# Patient Record
Sex: Female | Born: 1960
Health system: Southern US, Community
[De-identification: ages and names within clinical notes are randomized; demographics above are authoritative.]

## PROBLEM LIST (undated history)

## (undated) DIAGNOSIS — J45909 Unspecified asthma, uncomplicated: Secondary | ICD-10-CM

## (undated) DIAGNOSIS — G4733 Obstructive sleep apnea (adult) (pediatric): Secondary | ICD-10-CM

## (undated) DIAGNOSIS — G4730 Sleep apnea, unspecified: Secondary | ICD-10-CM

## (undated) DIAGNOSIS — I10 Essential (primary) hypertension: Secondary | ICD-10-CM

## (undated) DIAGNOSIS — M109 Gout, unspecified: Secondary | ICD-10-CM

## (undated) HISTORY — DX: Obstructive sleep apnea (adult) (pediatric): G47.33

## (undated) HISTORY — DX: Sleep apnea, unspecified: G47.30

## (undated) HISTORY — DX: Unspecified asthma, uncomplicated: J45.909

## (undated) HISTORY — DX: Essential (primary) hypertension: I10

---

## 2008-11-19 ENCOUNTER — Ambulatory Visit: Payer: Self-pay | Admitting: Family Medicine

## 2011-03-11 LAB — HM MAMMOGRAPHY

## 2011-06-09 HISTORY — PX: COLONOSCOPY: SHX174

## 2011-06-09 LAB — HM COLONOSCOPY

## 2014-07-24 ENCOUNTER — Ambulatory Visit: Payer: Self-pay | Admitting: Obstetrics and Gynecology

## 2014-07-24 DIAGNOSIS — I10 Essential (primary) hypertension: Secondary | ICD-10-CM

## 2014-07-24 DIAGNOSIS — Z01810 Encounter for preprocedural cardiovascular examination: Secondary | ICD-10-CM

## 2014-07-24 LAB — BASIC METABOLIC PANEL
Anion Gap: 5 — ABNORMAL LOW (ref 7–16)
BUN: 17 mg/dL (ref 7–18)
CHLORIDE: 106 mmol/L (ref 98–107)
CREATININE: 0.77 mg/dL (ref 0.60–1.30)
Calcium, Total: 8.5 mg/dL (ref 8.5–10.1)
Co2: 30 mmol/L (ref 21–32)
EGFR (African American): >60
Glucose: 129 mg/dL — ABNORMAL HIGH (ref 65–99)
OSMOLALITY: 284 (ref 275–301)
Potassium: 4 mmol/L (ref 3.5–5.1)
Sodium: 141 mmol/L (ref 136–145)

## 2014-07-24 LAB — CBC
HCT: 36.3 % (ref 35.0–47.0)
HGB: 11.6 g/dL — AB (ref 12.0–16.0)
MCH: 28.9 pg (ref 26.0–34.0)
MCHC: 32.1 g/dL (ref 32.0–36.0)
MCV: 90 fL (ref 80–100)
Platelet: 189 10*3/uL (ref 150–440)
RBC: 4.03 10*6/uL (ref 3.80–5.20)
RDW: 16.4 % — ABNORMAL HIGH (ref 11.5–14.5)
WBC: 7.9 10*3/uL (ref 3.6–11.0)

## 2014-08-01 ENCOUNTER — Ambulatory Visit: Payer: Self-pay | Admitting: Obstetrics and Gynecology

## 2014-08-05 LAB — PATHOLOGY REPORT

## 2015-03-07 NOTE — Op Note (Signed)
PATIENT NAME:  Amber Weiss, Amber Weiss MR#:  [id number] DATE OF BIRTH:  08-22-1961  DATE OF PROCEDURE:  08/01/2014  PREOPERATIVE DIAGNOSIS: Endometrial polyp.   POSTOPERATIVE DIAGNOSIS: Endometrial polyp including uterine synechiae.   OPERATION PERFORMED:  Hysteroscopy and dilatation and curettage.   ANESTHESIA USED: General.   PRIMARY SURGEON: Florina OuAndreas M. Bonney AidStaebler, MD.   ESTIMATED BLOOD LOSS: Minimal.   OPERATIVE FLUIDS: 800 mL of crystalloid.   URINE OUTPUT: 50 mL of clear urine.   PREOPERATIVE ANTIBIOTICS: None.    DRAINS OR TUBES: None.   IMPLANTS: None.   COMPLICATIONS: None.   SPECIMENS REMOVED: Endometrial curettings.   INTRAOPERATIVE FINDINGS: There was significant difficulty encountered in visualizing the patient's cervix with the operative speculum given the patient's habitus. The cervix was able to be visualized, but required vaginoscopy using the hysteroscope to visualize the cervix. The cervix was visually dilated using the hysteroscope and entry into the cavity revealed a normal-appearing right tubal ostia, the left tubal ostia was obscured by significant synechiae and there was a small endometrial polyp which was visualized in the anterior left portion of the uterus adjacent to the synechiae. The biopsies were obtained using a MyoSure device, completely resecting the endometrial polyp, biopsying the uterine synechiae as well as obtaining random biopsies anterior, posterior, and right endometrial walls. The cervix was grossly normal in appearance.   CONDITION FOLLOWING PROCEDURE: Stable.   PROCEDURE IN DETAIL: Risks, benefits, and alternatives of the procedure were discussed with the patient prior to proceeding to the operating room. Need for removal of the endometrial polyps was discussed and the patient was quoted a 5% risk of occult malignancy within the endometrial polyps. The patient was taken to the operating room where she was placed under general anesthesia. She was  positioned in the dorsal lithotomy position using Allen stirrups, prepped and draped in the usual sterile fashion. A timeout was performed. Attention was turned to the patient's pelvis. The bladder was straight catheterized with a red rubber catheter for 50 mL of clear urine. An operative speculum was then placed and the cervix was unable to be clearly visualized secondary to habitus as well as length of the operative speculum. Long weighted speculum was attempted to be utilized to visualize the cervix, but this was also unsuccessful. The hysteroscope was introduced through the introitus and followed up to the cervix, which was able to be visualized with the hysteroscope. The cervical os was identified and the hysteroscope was placed at the mouth of the cervical os and then the cervix was dilated using the hysteroscope itself, visually following the cervix. Upon entry into the uterine cavity it became apparent that there was significant synechiae on the left uterine sidewall, the patient does have a history of a prior D and C and this may be the root cause.  The right tubal ostia was clearly visualized. There was a polyp noted anteriorly on the left portion of the uterine cavity near the synechiae. Given the difficulty in visualizing the cervix as well as the fact that in order to gain entry into the uterus it required the full length of the hysteroscope the decision was made to proceed with D and C using a MyoSure device rather than a curette.  The endometrial polyp was completely resected using the MyoSure device. The uterine synechiae on the left were also biopsied using the MyoSure device. Anterior, posterior, and right uterine sidewall biopsies were then taken with the MyoSure device. The remainder of the myometrium looked grossly normal in  appearance. Upon retracting the hysteroscope the cervix was reinspected and noted to be grossly normal as far as cervical canal after removal of the hysteroscope. Sponge,  needle, and instrument counts were correct x 2. The patient tolerated the procedure well, was taken to the recovery room in stable condition.    ____________________________ Florina Ou. Bonney Aid, MD ams:bu D: 08/01/2014 11:29:12 ET T: 08/01/2014 13:09:33 ET JOB#: [id number]  cc: Florina Ou. Bonney Aid, MD, <Dictator> Lorrene Reid MD ELECTRONICALLY SIGNED 08/13/2014 8:03

## 2015-08-21 DIAGNOSIS — G4733 Obstructive sleep apnea (adult) (pediatric): Secondary | ICD-10-CM | POA: Insufficient documentation

## 2015-08-21 DIAGNOSIS — I10 Essential (primary) hypertension: Secondary | ICD-10-CM | POA: Insufficient documentation

## 2015-08-21 DIAGNOSIS — J45909 Unspecified asthma, uncomplicated: Secondary | ICD-10-CM | POA: Insufficient documentation

## 2015-08-24 ENCOUNTER — Ambulatory Visit: Payer: Self-pay | Admitting: Family Medicine

## 2015-09-20 ENCOUNTER — Encounter: Payer: Self-pay | Admitting: Gynecology

## 2015-09-20 ENCOUNTER — Ambulatory Visit
Admission: EM | Admit: 2015-09-20 | Discharge: 2015-09-20 | Disposition: A | Discharge status: Home / Self Care | Payer: No Typology Code available for payment source | Attending: Internal Medicine | Admitting: Internal Medicine

## 2015-09-20 DIAGNOSIS — M10071 Idiopathic gout, right ankle and foot: Secondary | ICD-10-CM

## 2015-09-20 HISTORY — DX: Gout, unspecified: M10.9

## 2015-09-20 MED ORDER — COLCHICINE 0.6 MG PO TABS: 0.6000 mg | ORAL_TABLET | Freq: Every day | ORAL | Status: DC

## 2015-09-20 MED ORDER — NAPROXEN 375 MG PO TABS: 375.0000 mg | ORAL_TABLET | Freq: Two times a day (BID) | ORAL | Status: DC

## 2015-09-20 NOTE — ED Notes (Signed)
Patient c/o right big gout flared up x couple days.

## 2015-09-20 NOTE — Discharge Instructions (Signed)
Naprosyn up to twice daily for pain Take your blood pressure meds right away  Gout Gout is an inflammatory arthritis caused by a buildup of uric acid crystals in the joints. Uric acid is a chemical that is normally present in the blood. When the level of uric acid in the blood is too high it can form crystals that deposit in your joints and tissues. This causes joint redness, soreness, and swelling (inflammation). Repeat attacks are common. Over time, uric acid crystals can form into masses (tophi) near a joint, destroying bone and causing disfigurement. Gout is treatable and often preventable. CAUSES  The disease begins with elevated levels of uric acid in the blood. Uric acid is produced by your body when it breaks down a naturally found substance called purines. Certain foods you eat, such as meats and fish, contain high amounts of purines. Causes of an elevated uric acid level include:  Being passed down from parent to child (heredity).  Diseases that cause increased uric acid production (such as obesity, psoriasis, and certain cancers).  Excessive alcohol use.  Diet, especially diets rich in meat and seafood.  Medicines, including certain cancer-fighting medicines (chemotherapy), water pills (diuretics), and aspirin.  Chronic kidney disease. The kidneys are no longer able to remove uric acid well.  Problems with metabolism. Conditions strongly associated with gout include:  Obesity.  High blood pressure.  High cholesterol.  Diabetes. Not everyone with elevated uric acid levels gets gout. It is not understood why some people get gout and others do not. Surgery, joint injury, and eating too much of certain foods are some of the factors that can lead to gout attacks. SYMPTOMS   An attack of gout comes on quickly. It causes intense pain with redness, swelling, and warmth in a joint.  Fever can occur.  Often, only one joint is involved. Certain joints are more commonly  involved:  Base of the big toe.  Knee.  Ankle.  Wrist.  Finger. Without treatment, an attack usually goes away in a few days to weeks. Between attacks, you usually will not have symptoms, which is different from many other forms of arthritis. DIAGNOSIS  Your caregiver will suspect gout based on your symptoms and exam. In some cases, tests may be recommended. The tests may include:  Blood tests.  Urine tests.  X-rays.  Joint fluid exam. This exam requires a needle to remove fluid from the joint (arthrocentesis). Using a microscope, gout is confirmed when uric acid crystals are seen in the joint fluid. TREATMENT  There are two phases to gout treatment: treating the sudden onset (acute) attack and preventing attacks (prophylaxis).  Treatment of an Acute Attack.  Medicines are used. These include anti-inflammatory medicines or steroid medicines.  An injection of steroid medicine into the affected joint is sometimes necessary.  The painful joint is rested. Movement can worsen the arthritis.  You may use warm or cold treatments on painful joints, depending which works best for you.  Treatment to Prevent Attacks.  If you suffer from frequent gout attacks, your caregiver may advise preventive medicine. These medicines are started after the acute attack subsides. These medicines either help your kidneys eliminate uric acid from your body or decrease your uric acid production. You may need to stay on these medicines for a very long time.  The early phase of treatment with preventive medicine can be associated with an increase in acute gout attacks. For this reason, during the first few months of treatment, your caregiver may  also advise you to take medicines usually used for acute gout treatment. Be sure you understand your caregiver's directions. Your caregiver may make several adjustments to your medicine dose before these medicines are effective.  Discuss dietary treatment with your  caregiver or dietitian. Alcohol and drinks high in sugar and fructose and foods such as meat, poultry, and seafood can increase uric acid levels. Your caregiver or dietitian can advise you on drinks and foods that should be limited. HOME CARE INSTRUCTIONS   Do not take aspirin to relieve pain. This raises uric acid levels.  Only take over-the-counter or prescription medicines for pain, discomfort, or fever as directed by your caregiver.  Rest the joint as much as possible. When in bed, keep sheets and blankets off painful areas.  Keep the affected joint raised (elevated).  Apply warm or cold treatments to painful joints. Use of warm or cold treatments depends on which works best for you.  Use crutches if the painful joint is in your leg.  Drink enough fluids to keep your urine clear or pale yellow. This helps your body get rid of uric acid. Limit alcohol, sugary drinks, and fructose drinks.  Follow your dietary instructions. Pay careful attention to the amount of protein you eat. Your daily diet should emphasize fruits, vegetables, whole grains, and fat-free or low-fat milk products. Discuss the use of coffee, vitamin C, and cherries with your caregiver or dietitian. These may be helpful in lowering uric acid levels.  Maintain a healthy body weight. SEEK MEDICAL CARE IF:   You develop diarrhea, vomiting, or any side effects from medicines.  You do not feel better in 24 hours, or you are getting worse. SEEK IMMEDIATE MEDICAL CARE IF:   Your joint becomes suddenly more tender, and you have chills or a fever. MAKE SURE YOU:   Understand these instructions.  Will watch your condition.  Will get help right away if you are not doing well or get worse.   This information is not intended to replace advice given to you by your health care provider. Make sure you discuss any questions you have with your health care provider.   Document Released: 10/28/2000 Document Revised: 11/21/2014  Document Reviewed: 06/13/2012 Elsevier Interactive Patient Education [employer]

## 2015-09-20 NOTE — ED Provider Notes (Signed)
CSN: [id number]     Arrival date & time 09/20/15  0804 History   First MD Initiated Contact with Patient 09/20/15 0827     Chief Complaint  Patient presents with  . Gout    HPI   Amber Weiss is a pleasant 54 y.o. female who presents with gout.  She reports she reports pain at right great toe.  She has hx of intermittent bouts of gout over the past 4 years.  She has been on colchicine intermittently for 4 years.  She just back on colchicine yesterday.  Pain is 4/10 now, 9/10 last night.  Feels stiff & tender to touch.  She took 4 Ibuprofen & it brought pain to 4/10.  No problems in other joints.  No fever or chills.  No erythema, warmth or rash.     Past Medical History  Diagnosis Date  . Hypertension   . Asthma   . OSA (obstructive sleep apnea)   . Gout    Past Surgical History  Procedure Laterality Date  . Colonoscopy  06/09/11  . Cesarean section     Family History  Problem Relation Age of Onset  . Cancer Mother     breast  . Hypertension Mother   . Cancer Father     skin  . Diabetes Father   . Cancer Sister     bladder   Social History  Substance Use Topics  . Smoking status: Former Games developer  . Smokeless tobacco: Never Used  . Alcohol Use: No   OB History    No data available     Review of Systems  Constitutional: Positive for activity change. Negative for fever, chills, diaphoresis, appetite change, fatigue and unexpected weight change.  HENT: Negative.   Eyes: Negative.   Respiratory: Negative.   Cardiovascular: Negative.   Gastrointestinal: Negative.   Genitourinary: Negative.   Musculoskeletal: Positive for joint swelling and gait problem. Negative for myalgias, back pain, arthralgias, neck pain and neck stiffness.  Skin: Negative.   Neurological: Negative.   Psychiatric/Behavioral: Negative.     Allergies  Penicillins  Home Medications   Prior to Admission medications   Medication Sig Start Date End Date Taking? Authorizing Provider  amLODipine  (NORVASC) 5 MG tablet Take 5 mg by mouth daily.   Yes Historical Provider, MD  budesonide-formoterol (SYMBICORT) 160-4.5 MCG/ACT inhaler Inhale 2 puffs into the lungs 2 (two) times daily.   Yes Historical Provider, MD  albuterol (PROVENTIL HFA;VENTOLIN HFA) 108 (90 BASE) MCG/ACT inhaler Inhale 2 puffs into the lungs every 6 (six) hours as needed for wheezing or shortness of breath.    Historical Provider, MD  colchicine 0.6 MG tablet Take 1 tablet (0.6 mg total) by mouth daily. Take 2 tabs today, then one daily 09/20/15   Joselyn Arrow, NP  losartan (COZAAR) 100 MG tablet Take 100 mg by mouth daily.    Historical Provider, MD  naproxen (NAPROSYN) 375 MG tablet Take 1 tablet (375 mg total) by mouth 2 (two) times daily. 09/20/15   Joselyn Arrow, NP   Meds Ordered and Administered this Visit  Medications - No data to display  BP 147/93 mmHg  Pulse 63  Temp(Src) 97.8 F (36.6 C) (Oral)  Resp 18  Ht  (1.549 m)  Wt 267 lb (121.11 kg)  BMI 50.48 kg/m2  SpO2 98% No data found.   Physical Exam  Constitutional: She is oriented to person, place, and time. She appears well-developed and well-nourished. No distress.  HENT:  Head: Normocephalic and atraumatic.  Eyes: Conjunctivae are normal. Pupils are equal, round, and reactive to light. No scleral icterus.  Neck: Normal range of motion. Neck supple. No thyromegaly present.  Cardiovascular: Normal rate, regular rhythm and normal heart sounds.   Pulmonary/Chest: Effort normal and breath sounds normal. No respiratory distress. She has no wheezes. She has no rales.  Abdominal: Soft. Bowel sounds are normal. She exhibits no distension. There is no tenderness.  Musculoskeletal: Normal range of motion. She exhibits no edema or tenderness.       Feet:  Neurological: She is alert and oriented to person, place, and time. No cranial nerve deficit.  Skin: Skin is warm and dry. No rash noted. No erythema.  Psychiatric: She has a normal mood and  affect. Her speech is normal and behavior is normal. Judgment and thought content normal. Cognition and memory are normal.  Nursing note and vitals reviewed.   ED Course  Procedures none  MDM   1. Acute idiopathic gout of right foot    Right great toe, acute recurrence  Plan: Diagnosis and treatment reviewed with patient Rx as per orders;  benefits, risks, potential side effects reviewed  Recommend supportive treatment with rest, elevation Seek additional medical care if symptoms worsen or are not improving Phone Number given for PCP for patient to establish care.  She is encouraged to return here if symptoms persist prior to appointment with PCP. Patient advised to take her antihypertensive now as she has not yet this morning & BP a bit high.   Joselyn ArrowKandice L Quianna Avery, NP 09/20/15 0901  Joselyn ArrowKandice L Shaylyn Bawa, NP 09/20/15 [phone]

## 2015-09-30 ENCOUNTER — Ambulatory Visit (INDEPENDENT_AMBULATORY_CARE_PROVIDER_SITE_OTHER): Payer: No Typology Code available for payment source | Admitting: Family Medicine

## 2015-09-30 ENCOUNTER — Encounter: Payer: Self-pay | Admitting: Family Medicine

## 2015-09-30 VITALS — BP 163/88 | HR 72 | Temp 97.8°F | Ht 62.5 in | Wt 265.0 lb

## 2015-09-30 DIAGNOSIS — G4733 Obstructive sleep apnea (adult) (pediatric): Secondary | ICD-10-CM

## 2015-09-30 DIAGNOSIS — Z23 Encounter for immunization: Secondary | ICD-10-CM | POA: Diagnosis not present

## 2015-09-30 DIAGNOSIS — Z0000 Encounter for general adult medical examination without abnormal findings: Secondary | ICD-10-CM | POA: Insufficient documentation

## 2015-09-30 DIAGNOSIS — I10 Essential (primary) hypertension: Secondary | ICD-10-CM

## 2015-09-30 DIAGNOSIS — R5383 Other fatigue: Secondary | ICD-10-CM | POA: Diagnosis not present

## 2015-09-30 DIAGNOSIS — M1A9XX0 Chronic gout, unspecified, without tophus (tophi): Secondary | ICD-10-CM | POA: Insufficient documentation

## 2015-09-30 MED ORDER — AMLODIPINE BESYLATE 10 MG PO TABS: 10.0000 mg | ORAL_TABLET | Freq: Every day | ORAL | Status: DC

## 2015-09-30 NOTE — Assessment & Plan Note (Signed)
DASH guidelines; increase CCB to 10 mg daily; work on weight loss

## 2015-09-30 NOTE — Assessment & Plan Note (Signed)
Refer to ENT for evaluation for her OSA; she does not want to wear machine; do NOT drive when somnolent, danger of crash, fatalities

## 2015-09-30 NOTE — Progress Notes (Signed)
BP 163/88 mmHg  Pulse 72  Temp(Src) 97.8 F (36.6 C)  Ht 5' 2.5" (1.588 m)  Wt 265 lb (120.203 kg)  BMI 47.67 kg/m2  SpO2 95%   Subjective:    Patient ID: Amber Weiss, female    DOB: 06/14/1961, 54 y.o.   MRN: [id number]  HPI: Amber Weiss is a 55 y.o. female  Chief Complaint  Patient presents with  . Falling Asleep During the Day    She feels like she is sleeping ok at night. She has had a sleep study done approx 3 years ago but doesn't use the CPAP because it blows on her. She does snore alot.  . Obesity    Discuss weight gain    This has been going on for years She had a sleep study and she was just barely where she needed a machine; she has a machine and doesn't use it; she had the sleep study across the street from hospital; they did the machine and everything there Fatigue and sleep quality getting worse She has gained significant weight; our chart shows Lost 20 pounds with TOPS, take off pounds sensibly; meetings once a week; support group; but then her children play travel soccer and she couldn't go to the meetings, gained weight back; mother had thyroid disease (hyperthyroid)  Provider here sent her to a cardiologist years ago and they found some abnormal heart beat and that was it; Dr. Gwen Pounds was her cardiologist  Last physical and labs with last physical a year ago at GYN/family office  She has HTN; has been out of medicine; they won't refill it because she does not have a doctor (I told she has one now and I'll be glad to refill it); she ran out of amlodipine one week ago; she thinks it was "okay" but thought it was high still and the dose could stand to be increased; she used to take losartan but it had a diuretic which triggered gout flares; she thinks they told her to stop the losartan and the HCTZ  Relevant past medical, surgical, family and social history reviewed and updated as indicated. Interim medical history since our last visit reviewed. Allergies  and medications reviewed and updated.  Review of Systems Per HPI unless specifically indicated above     Objective:    BP 163/88 mmHg  Pulse 72  Temp(Src) 97.8 F (36.6 C)  Ht 5' 2.5" (1.588 m)  Wt 265 lb (120.203 kg)  BMI 47.67 kg/m2  SpO2 95%  Wt Readings from Last 3 Encounters:  09/30/15 265 lb (120.203 kg)  09/20/15 267 lb (121.11 kg)  03/18/13 241 lb (109.317 kg)  neck circumference 17.5 inches  Physical Exam  Constitutional: She appears well-developed and well-nourished. No distress.  Morbidly obese  HENT:  Head: Normocephalic and atraumatic.  Mouth/Throat: Uvula is midline, oropharynx is clear and moist and mucous membranes are normal.  Crowded posterior OP  Eyes: EOM are normal. No scleral icterus.  Neck: No thyromegaly present.  Cardiovascular: Normal rate, regular rhythm and normal heart sounds.   No murmur heard. Pulmonary/Chest: Effort normal and breath sounds normal. No respiratory distress. She has no wheezes.  Abdominal: She exhibits no distension.  Musculoskeletal: Normal range of motion. She exhibits no edema.  Neurological: She is alert. She exhibits normal muscle tone.  Skin: Skin is warm and dry. She is not diaphoretic. No pallor.  Psychiatric: She has a normal mood and affect. Her behavior is normal. Judgment and thought content normal.  Assessment & Plan:   Problem List Items Addressed This Visit      Cardiovascular and Mediastinum   Hypertension    DASH guidelines; increase CCB to 10 mg daily; work on weight loss      Relevant Medications   amLODipine (NORVASC) 10 MG tablet     Respiratory   OSA (obstructive sleep apnea) - Primary    Refer to ENT for evaluation for her OSA; she does not want to wear machine; do NOT drive when somnolent, danger of crash, fatalities      Relevant Orders   CBC with Differential/Platelet (Completed)     Other   Needs flu shot   Fatigue   Relevant Orders   CBC with Differential/Platelet  (Completed)   VITAMIN D 25 Hydroxy (Vit-D Deficiency, Fractures) (Completed)   TSH (Completed)   Vitamin B12 (Completed)   Morbid obesity (HCC)   Relevant Orders   Lipid Panel w/o Chol/HDL Ratio (Completed)   Comprehensive metabolic panel (Completed)   TSH (Completed)    Other Visit Diagnoses    Need for influenza vaccination        Relevant Orders    Flu Vaccine QUAD 36+ mos PF IM (Fluarix & Fluzone Quad PF) (Completed)       Follow up plan: Return in about 4 weeks (around 10/28/2015).  An after-visit summary was printed and given to the patient at check-out.  Please see the patient instructions which may contain other information and recommendations beyond what is mentioned above in the assessment and plan.  Orders Placed This Encounter  Procedures  . Flu Vaccine QUAD 36+ mos PF IM (Fluarix & Fluzone Quad PF)  . CBC with Differential/Platelet  . Lipid Panel w/o Chol/HDL Ratio  . Comprehensive metabolic panel  . VITAMIN D 25 Hydroxy (Vit-D Deficiency, Fractures)  . TSH  . Vitamin B12   Meds ordered this encounter  Medications  . amLODipine (NORVASC) 10 MG tablet    Sig: Take 1 tablet (10 mg total) by mouth daily.    Dispense:  90 tablet    Refill:  3

## 2015-09-30 NOTE — Assessment & Plan Note (Signed)
Check uric acid; avoid HCTZ since she had a flare of gout after that

## 2015-09-30 NOTE — Patient Instructions (Addendum)
We'll refer you to the ENT specialist DO NOT DRIVE if somnolent, as sleep-deprived driving can increase your risk of crashes and fatalities We'll check labs today Return for a physical when due  Check out the information at familydoctor.org entitled "What It Takes to Lose Weight" Try to lose between 1-2 pounds per week by taking in fewer calories and burning off more calories You can succeed by limiting portions, limiting foods dense in calories and fat, becoming more active, and drinking 8 glasses of water a day (64 ounces) Don't skip meals, especially breakfast, as skipping meals may alter your metabolism Do not use over-the-counter weight loss pills or gimmicks that claim rapid weight loss A healthy BMI (or body mass index) is between 18.5 and 24.9 You can calculate your ideal BMI at the NIH website [employer]hu  You received the flu shot today; it should protect you against the flu virus over the coming months; it will take about two weeks for antibodies to develop; do try to stay away from hospitals, nursing homes, and daycares during peak flu season; taking 1000 mg of vitamin C daily during flu season may help you avoid getting sick  Let's treat your blood pressure with the 10 mg amlodipine daily  If you have not heard anything from my staff in a week about any orders/referrals/studies from today, please contact us here to follow-up [phone]  Try the DASH guidelines  DASH Eating Plan DASH stands for "Dietary Approaches to Stop Hypertension." The DASH eating plan is a healthy eating plan that has been shown to reduce high blood pressure (hypertension). Additional health benefits may include reducing the risk of type 2 diabetes mellitus, heart disease, and stroke. The DASH eating plan may also help with weight loss. WHAT DO I NEED TO KNOW ABOUT THE DASH EATING PLAN? For the DASH eating plan, you will follow these general  guidelines:  Choose foods with a percent daily value for sodium of less than 5% (as listed on the food label).  Use salt-free seasonings or herbs instead of table salt or sea salt.  Check with your health care provider or pharmacist before using salt substitutes.  Eat lower-sodium products, often labeled as "lower sodium" or "no salt added."  Eat fresh foods.  Eat more vegetables, fruits, and low-fat dairy products.  Choose whole grains. Look for the word "whole" as the first word in the ingredient list.  Choose fish and skinless chicken or [place] more often than red meat. Limit fish, poultry, and meat to 6 oz (170 g) each day.  Limit sweets, desserts, sugars, and sugary drinks.  Choose heart-healthy fats.  Limit cheese to 1 oz (28 g) per day.  Eat more home-cooked food and less restaurant, buffet, and fast food.  Limit fried foods.  Cook foods using methods other than frying.  Limit canned vegetables. If you do use them, rinse them well to decrease the sodium.  When eating at a restaurant, ask that your food be prepared with less salt, or no salt if possible. WHAT FOODS CAN I EAT? Seek help from a dietitian for individual calorie needs. Grains Whole grain or whole wheat bread. Brown rice. Whole grain or whole wheat pasta. Quinoa, bulgur, and whole grain cereals. Low-sodium cereals. Corn or whole wheat flour tortillas. Whole grain cornbread. Whole grain crackers. Low-sodium crackers. Vegetables Fresh or frozen vegetables (raw, steamed, roasted, or grilled). Low-sodium or reduced-sodium tomato and vegetable juices. Low-sodium or reduced-sodium tomato sauce and paste. Low-sodium or reduced-sodium canned vegetables.  Fruits All fresh, canned (in natural juice), or frozen fruits. Meat and Other Protein Products Ground beef (85% or leaner), grass-fed beef, or beef trimmed of fat. Skinless chicken or [place]. Ground chicken or [place]. Pork trimmed of fat. All fish and seafood.  Eggs. Dried beans, peas, or lentils. Unsalted nuts and seeds. Unsalted canned beans. Dairy Low-fat dairy products, such as skim or 1% milk, 2% or reduced-fat cheeses, low-fat ricotta or cottage cheese, or plain low-fat yogurt. Low-sodium or reduced-sodium cheeses. Fats and Oils Tub margarines without trans fats. Light or reduced-fat mayonnaise and salad dressings (reduced sodium). Avocado. Safflower, olive, or canola oils. Natural peanut or almond butter. Other Unsalted popcorn and pretzels. The items listed above may not be a complete list of recommended foods or beverages. Contact your dietitian for more options. WHAT FOODS ARE NOT RECOMMENDED? Grains White bread. White pasta. White rice. Refined cornbread. Bagels and croissants. Crackers that contain trans fat. Vegetables Creamed or fried vegetables. Vegetables in a cheese sauce. Regular canned vegetables. Regular canned tomato sauce and paste. Regular tomato and vegetable juices. Fruits Dried fruits. Canned fruit in light or heavy syrup. Fruit juice. Meat and Other Protein Products Fatty cuts of meat. Ribs, chicken wings, bacon, sausage, bologna, salami, chitterlings, fatback, hot dogs, bratwurst, and packaged luncheon meats. Salted nuts and seeds. Canned beans with salt. Dairy Whole or 2% milk, cream, half-and-half, and cream cheese. Whole-fat or sweetened yogurt. Full-fat cheeses or blue cheese. Nondairy creamers and whipped toppings. Processed cheese, cheese spreads, or cheese curds. Condiments Onion and garlic salt, seasoned salt, table salt, and sea salt. Canned and packaged gravies. Worcestershire sauce. Tartar sauce. Barbecue sauce. Teriyaki sauce. Soy sauce, including reduced sodium. Steak sauce. Fish sauce. Oyster sauce. Cocktail sauce. Horseradish. Ketchup and mustard. Meat flavorings and tenderizers. Bouillon cubes. Hot sauce. Tabasco sauce. Marinades. Taco seasonings. Relishes. Fats and Oils Butter, stick margarine, lard,  shortening, ghee, and bacon fat. Coconut, palm kernel, or palm oils. Regular salad dressings. Other Pickles and olives. Salted popcorn and pretzels. The items listed above may not be a complete list of foods and beverages to avoid. Contact your dietitian for more information. WHERE CAN I FIND MORE INFORMATION? National Heart, Lung, and Blood Institute: CableProm[employer].it   This information is not intended to replace advice given to you by your health care provider. Make sure you discuss any questions you have with your health care provider.   Document Released: 10/20/2011 Document Revised: 11/21/2014 Document Reviewed: 09/04/2013 Elsevier Interactive Patient Education 2016 Elsevier Inc. Sleep Apnea  Sleep apnea is a sleep disorder characterized by abnormal pauses in breathing while you sleep. When your breathing pauses, the level of oxygen in your blood decreases. This causes you to move out of deep sleep and into light sleep. As a result, your quality of sleep is poor, and the system that carries your blood throughout your body (cardiovascular system) experiences stress. If sleep apnea remains untreated, the following conditions can develop:  High blood pressure (hypertension).  Coronary artery disease.  Inability to achieve or maintain an erection (impotence).  Impairment of your thought process (cognitive dysfunction). There are three types of sleep apnea: 1. Obstructive sleep apnea--Pauses in breathing during sleep because of a blocked airway. 2. Central sleep apnea--Pauses in breathing during sleep because the area of the brain that controls your breathing does not send the correct signals to the muscles that control breathing. 3. Mixed sleep apnea--A combination of both obstructive and central sleep apnea. RISK FACTORS The following risk factors can  increase your risk of developing sleep apnea:  Being overweight.  Smoking.  Having narrow  passages in your nose and throat.  Being of older age.  Being female.  Alcohol use.  Sedative and tranquilizer use.  Ethnicity. Among individuals younger than 35 years, African Americans are at increased risk of sleep apnea. SYMPTOMS   Difficulty staying asleep.  Daytime sleepiness and fatigue.  Loss of energy.  Irritability.  Loud, heavy snoring.  Morning headaches.  Trouble concentrating.  Forgetfulness.  Decreased interest in sex.  Unexplained sleepiness. DIAGNOSIS  In order to diagnose sleep apnea, your caregiver will perform a physical examination. A sleep study done in the comfort of your own home may be appropriate if you are otherwise healthy. Your caregiver may also recommend that you spend the night in a sleep lab. In the sleep lab, several monitors record information about your heart, lungs, and brain while you sleep. Your leg and arm movements and blood oxygen level are also recorded. TREATMENT The following actions may help to resolve mild sleep apnea:  Sleeping on your side.   Using a decongestant if you have nasal congestion.   Avoiding the use of depressants, including alcohol, sedatives, and narcotics.   Losing weight and modifying your diet if you are overweight. There also are devices and treatments to help open your airway:  Oral appliances. These are custom-made mouthpieces that shift your lower jaw forward and slightly open your bite. This opens your airway.  Devices that create positive airway pressure. This positive pressure "splints" your airway open to help you breathe better during sleep. The following devices create positive airway pressure:  Continuous positive airway pressure (CPAP) device. The CPAP device creates a continuous level of air pressure with an air pump. The air is delivered to your airway through a mask while you sleep. This continuous pressure keeps your airway open.  Nasal expiratory positive airway pressure (EPAP)  device. The EPAP device creates positive air pressure as you exhale. The device consists of single-use valves, which are inserted into each nostril and held in place by adhesive. The valves create very little resistance when you inhale but create much more resistance when you exhale. That increased resistance creates the positive airway pressure. This positive pressure while you exhale keeps your airway open, making it easier to breath when you inhale again.  Bilevel positive airway pressure (BPAP) device. The BPAP device is used mainly in patients with central sleep apnea. This device is similar to the CPAP device because it also uses an air pump to deliver continuous air pressure through a mask. However, with the BPAP machine, the pressure is set at two different levels. The pressure when you exhale is lower than the pressure when you inhale.  Surgery. Typically, surgery is only done if you cannot comply with less invasive treatments or if the less invasive treatments do not improve your condition. Surgery involves removing excess tissue in your airway to create a wider passage way.   This information is not intended to replace advice given to you by your health care provider. Make sure you discuss any questions you have with your health care provider.   Document Released: 10/21/2002 Document Revised: 11/21/2014 Document Reviewed: 03/08/2012 Elsevier Interactive Patient Education [employer]2016 Elsevier Inc.

## 2015-10-01 LAB — COMPREHENSIVE METABOLIC PANEL
ALK PHOS: 86 IU/L (ref 39–117)
ALT: 20 IU/L (ref 0–32)
AST: 19 IU/L (ref 0–40)
Albumin/Globulin Ratio: 1.5 (ref 1.1–2.5)
Albumin: 4.3 g/dL (ref 3.5–5.5)
BUN/Creatinine Ratio: 25 — ABNORMAL HIGH (ref 9–23)
BUN: 20 mg/dL (ref 6–24)
Bilirubin Total: 0.6 mg/dL (ref 0.0–1.2)
CALCIUM: 9 mg/dL (ref 8.7–10.2)
CO2: 29 mmol/L (ref 18–29)
CREATININE: 0.81 mg/dL (ref 0.57–1.00)
Chloride: 101 mmol/L (ref 97–106)
GFR calc Af Amer: 95 mL/min/{1.73_m2} (ref >59)
GFR, EST NON AFRICAN AMERICAN: 83 mL/min/{1.73_m2} (ref >59)
GLUCOSE: 84 mg/dL (ref 65–99)
Globulin, Total: 2.8 g/dL (ref 1.5–4.5)
Potassium: 4.4 mmol/L (ref 3.5–5.2)
SODIUM: 143 mmol/L (ref 136–144)
Total Protein: 7.1 g/dL (ref 6.0–8.5)

## 2015-10-01 LAB — CBC WITH DIFFERENTIAL/PLATELET
BASOS: 1 %
Basophils Absolute: 0 10*3/uL (ref 0.0–0.2)
EOS (ABSOLUTE): 0.4 10*3/uL (ref 0.0–0.4)
EOS: 6 %
HEMATOCRIT: 38 % (ref 34.0–46.6)
Hemoglobin: 12.4 g/dL (ref 11.1–15.9)
IMMATURE GRANS (ABS): 0 10*3/uL (ref 0.0–0.1)
IMMATURE GRANULOCYTES: 0 %
LYMPHS: 20 %
Lymphocytes Absolute: 1.3 10*3/uL (ref 0.7–3.1)
MCH: 30 pg (ref 26.6–33.0)
MCHC: 32.6 g/dL (ref 31.5–35.7)
MCV: 92 fL (ref 79–97)
MONOS ABS: 0.3 10*3/uL (ref 0.1–0.9)
Monocytes: 5 %
NEUTROS ABS: 4.5 10*3/uL (ref 1.4–7.0)
NEUTROS PCT: 68 %
Platelets: 180 10*3/uL (ref 150–379)
RBC: 4.14 x10E6/uL (ref 3.77–5.28)
RDW: 14.7 % (ref 12.3–15.4)
WBC: 6.6 10*3/uL (ref 3.4–10.8)

## 2015-10-01 LAB — LIPID PANEL W/O CHOL/HDL RATIO
Cholesterol, Total: 156 mg/dL (ref 100–199)
HDL: 38 mg/dL — AB (ref >39)
LDL CALC: 82 mg/dL (ref 0–99)
Triglycerides: 178 mg/dL — ABNORMAL HIGH (ref 0–149)
VLDL CHOLESTEROL CAL: 36 mg/dL (ref 5–40)

## 2015-10-01 LAB — VITAMIN B12: VITAMIN B 12: 366 pg/mL (ref 211–946)

## 2015-10-01 LAB — VITAMIN D 25 HYDROXY (VIT D DEFICIENCY, FRACTURES): Vit D, 25-Hydroxy: 18.7 ng/mL — ABNORMAL LOW (ref 30.0–100.0)

## 2015-10-01 LAB — TSH: TSH: 3.74 u[IU]/mL (ref 0.450–4.500)

## 2015-10-02 ENCOUNTER — Telehealth: Payer: Self-pay | Admitting: Family Medicine

## 2015-10-02 NOTE — Telephone Encounter (Signed)
Low vit D, low B12, mildly low HDL

## 2015-10-12 NOTE — Telephone Encounter (Signed)
Patient returned your phone call.

## 2015-10-13 ENCOUNTER — Telehealth: Payer: Self-pay | Admitting: Family Medicine

## 2015-10-13 NOTE — Telephone Encounter (Signed)
I talked with patient, explained B12 low, take supplement twice a week; vit D 2000 iu daily for 3-4 months, then 1000 iu daily; increase activity

## 2015-10-13 NOTE — Telephone Encounter (Signed)
I talked with her about labs, start 2000 iu vit D3 daily for 3-4 months, then 1000 iu daily Take B12 twice a week More activity

## 2015-10-28 ENCOUNTER — Ambulatory Visit: Payer: No Typology Code available for payment source | Admitting: Family Medicine

## 2015-11-06 ENCOUNTER — Ambulatory Visit: Payer: No Typology Code available for payment source | Admitting: Family Medicine

## 2015-12-10 ENCOUNTER — Encounter: Payer: Self-pay | Admitting: Family Medicine

## 2015-12-10 ENCOUNTER — Ambulatory Visit (INDEPENDENT_AMBULATORY_CARE_PROVIDER_SITE_OTHER): Payer: BLUE CROSS/BLUE SHIELD | Admitting: Family Medicine

## 2015-12-10 DIAGNOSIS — B353 Tinea pedis: Secondary | ICD-10-CM

## 2015-12-10 DIAGNOSIS — G4733 Obstructive sleep apnea (adult) (pediatric): Secondary | ICD-10-CM

## 2015-12-10 DIAGNOSIS — I10 Essential (primary) hypertension: Secondary | ICD-10-CM | POA: Diagnosis not present

## 2015-12-10 DIAGNOSIS — B351 Tinea unguium: Secondary | ICD-10-CM | POA: Diagnosis not present

## 2015-12-10 DIAGNOSIS — E538 Deficiency of other specified B group vitamins: Secondary | ICD-10-CM | POA: Diagnosis not present

## 2015-12-10 DIAGNOSIS — E559 Vitamin D deficiency, unspecified: Secondary | ICD-10-CM

## 2015-12-10 MED ORDER — ECONAZOLE NITRATE 1 % EX CREA: TOPICAL_CREAM | Freq: Every day | CUTANEOUS | Status: DC

## 2015-12-10 MED ORDER — CICLOPIROX 8 % EX SOLN: Freq: Every day | CUTANEOUS | Status: DC

## 2015-12-10 NOTE — Assessment & Plan Note (Addendum)
Discussed weight management strategies; decrease total calories, increase activity; refer to bariatric surgery; discussed various options for surgical treatment for weight loss; Roux-en-Y bypass may be the one she is most concerned about; gastric banding may be more amenable; see AVS

## 2015-12-10 NOTE — Patient Instructions (Addendum)
Try to use PLAIN allergy medicine without the decongestant Avoid: phenylephrine, phenylpropanolamine, and pseudoephredine We'll refer you to the bariatric surgeon and the ENT If you have not heard anything from my staff in a week about any orders/referrals/studies from today, please contact us here to follow-up [phone]  Take 2000 iu vitamin D3 daily for about 3 months, then you back down to 1000 iu daily Okay to take vitamin B12 a few days per week  Return as needed for bariatric issues Return in 6 months for fasting labs and visit  Check out the information at [email or web address].org entitled "What It Takes to Lose Weight" Try to lose between 1-2 pounds per week by taking in fewer calories and burning off more calories You can succeed by limiting portions, limiting foods dense in calories and fat, becoming more active, and drinking 8 glasses of water a day (64 ounces) Don't skip meals, especially breakfast, as skipping meals may alter your metabolism Do not use over-the-counter weight loss pills or gimmicks that claim rapid weight loss A healthy BMI (or body mass index) is between 18.5 and 24.9 You can calculate your ideal BMI at the NIH website [employer].hu

## 2015-12-10 NOTE — Progress Notes (Signed)
BP 123/79 mmHg  Pulse 75  Temp(Src) 98.6 F (37 C)  Wt 267 lb (121.11 kg)  SpO2 94%   Subjective:    Patient ID: Amber Weiss, female    DOB: Nov 11, 1961, 55 y.o.   MRN: [id number]  HPI: Amber Weiss is a 55 y.o. female  Chief Complaint  Patient presents with  . Hypertension    routine follow up  . Obesity   Here for reguar f/u Hypertension; does not check BP away from doctor She does love her salt; likes potato chips; nothing new, going on for most of her life Tries to not salt just as much; not checking labels for sodium contact; does not much cans Does buy ham No decongestants Loves black licorice but does not eat very often  She has obesity; she has not lost weight; she hates to take medicine; she has considered bariatric surgery She was worried about having to take pills the rest of her life if she has surgery She was in [employer], not [employer]; she can lose 20-40 pounds but then weight comes back and then some  Vitamin D deficiency; taking vitamin D but only taking 1000 iu daily Vitamin B12 was 366, taking supplement now  No medical excitement in the interim since last visit  Relevant past medical, surgical, family and social history reviewed and updated as indicated Father had diabetes; he died from melanoma; she sees skin doctor regularly  Allergies and medications reviewed and updated.  Review of Systems Per HPI unless specifically indicated above     Objective:    BP 123/79 mmHg  Pulse 75  Temp(Src) 98.6 F (37 C)  Wt 267 lb (121.11 kg)  SpO2 94%  Wt Readings from Last 3 Encounters:  12/10/15 267 lb (121.11 kg)  09/30/15 265 lb (120.203 kg)  09/20/15 267 lb (121.11 kg)   body mass index is 48.03 kg/(m^2).  Physical Exam  Constitutional: She appears well-developed and well-nourished.  HENT:  Mouth/Throat: Mucous membranes are normal.  Eyes: EOM are normal. No scleral icterus.  Cardiovascular: Normal rate and regular rhythm.    Pulmonary/Chest: Effort normal and breath sounds normal.  Abdominal: She exhibits no distension.  Musculoskeletal: She exhibits no edema.  Neurological: She is alert.  Skin:  Rash on the left foot, scaly skin, mild erythema; toenail on great toe and pinky toe thickened with yellowish discoloration  Psychiatric: She has a normal mood and affect. Her behavior is normal.   Results for orders placed or performed in visit on 09/30/15  CBC with Differential/Platelet  Result Value Ref Range   WBC 6.6 3.4 - 10.8 x10E3/uL   RBC 4.14 3.77 - 5.28 x10E6/uL   Hemoglobin 12.4 11.1 - 15.9 g/dL   Hematocrit 40.9 81.1 - 46.6 %   MCV 92 79 - 97 fL   MCH 30.0 26.6 - 33.0 pg   MCHC 32.6 31.5 - 35.7 g/dL   RDW 91.4 78.2 - 95.6 %   Platelets 180 150 - 379 x10E3/uL   Neutrophils 68 %   Lymphs 20 %   Monocytes 5 %   Eos 6 %   Basos 1 %   Neutrophils Absolute 4.5 1.4 - 7.0 x10E3/uL   Lymphocytes Absolute 1.3 0.7 - 3.1 x10E3/uL   Monocytes Absolute 0.3 0.1 - 0.9 x10E3/uL   EOS (ABSOLUTE) 0.4 0.0 - 0.4 x10E3/uL   Basophils Absolute 0.0 0.0 - 0.2 x10E3/uL   Immature Granulocytes 0 %   Immature Grans (Abs) 0.0 0.0 -  0.1 x10E3/uL  Lipid Panel w/o Chol/HDL Ratio  Result Value Ref Range   Cholesterol, Total 156 100 - 199 mg/dL   Triglycerides 132 (H) 0 - 149 mg/dL   HDL 38 (L) >44 mg/dL   VLDL Cholesterol Cal 36 5 - 40 mg/dL   LDL Calculated 82 0 - 99 mg/dL  Comprehensive metabolic panel  Result Value Ref Range   Glucose 84 65 - 99 mg/dL   BUN 20 6 - 24 mg/dL   Creatinine, Ser 0.10 0.57 - 1.00 mg/dL   GFR calc non Af Amer 83 >59 mL/min/1.73   GFR calc Af Amer 95 >59 mL/min/1.73   BUN/Creatinine Ratio 25 (H) 9 - 23   Sodium 143 136 - 144 mmol/L   Potassium 4.4 3.5 - 5.2 mmol/L   Chloride 101 97 - 106 mmol/L   CO2 29 18 - 29 mmol/L   Calcium 9.0 8.7 - 10.2 mg/dL   Total Protein 7.1 6.0 - 8.5 g/dL   Albumin 4.3 3.5 - 5.5 g/dL   Globulin, Total 2.8 1.5 - 4.5 g/dL   Albumin/Globulin Ratio 1.5 1.1  - 2.5   Bilirubin Total 0.6 0.0 - 1.2 mg/dL   Alkaline Phosphatase 86 39 - 117 IU/L   AST 19 0 - 40 IU/L   ALT 20 0 - 32 IU/L  VITAMIN D 25 Hydroxy (Vit-D Deficiency, Fractures)  Result Value Ref Range   Vit D, 25-Hydroxy 18.7 (L) 30.0 - 100.0 ng/mL  TSH  Result Value Ref Range   TSH 3.740 0.450 - 4.500 uIU/mL  Vitamin B12  Result Value Ref Range   Vitamin B-12 366 211 - 946 pg/mL      Assessment & Plan:   Problem List Items Addressed This Visit      Cardiovascular and Mediastinum   Hypertension (Chronic)    Controlled today; weight loss would also help; continue CCB        Respiratory   OSA (obstructive sleep apnea)    Refer to ENT for evaluation for OSA; not wearing the machine; do not drive when sleepy      Relevant Orders   Ambulatory referral to ENT     Digestive   Vitamin B12 deficiency    Continue supplementation        Other   Morbid obesity (HCC) - Primary (Chronic)    Discussed weight management strategies; decrease total calories, increase activity; refer to bariatric surgery; discussed various options for surgical treatment for weight loss; Roux-en-Y bypass may be the one she is most concerned about; gastric banding may be more amenable; see AVS      Relevant Orders   Ambulatory referral to General Surgery   Ambulatory referral to ENT   Vitamin D deficiency    Continue supplementation       Other Visit Diagnoses    Onychomycosis        Relevant Medications    ciclopirox (PENLAC) 8 % solution    econazole nitrate 1 % cream    Tinea pedis, left        discussed med options; I prefer to use topicals; she agrees; Rx sent, may take 6-98 months; separate clippers    Relevant Medications    ciclopirox (PENLAC) 8 % solution    econazole nitrate 1 % cream        Follow up plan: Return in about 6 months (around 06/08/2016) for thirty minute follow-up with fasting labs.  An after-visit summary was printed and given to the patient  at check-out.   Please see the patient instructions which may contain other information and recommendations beyond what is mentioned above in the assessment and plan.  Orders Placed This Encounter  Procedures  . Ambulatory referral to General Surgery  . Ambulatory referral to ENT   Meds ordered this encounter  Medications  . Misc Natural Products (TART CHERRY ADVANCED PO)    Sig: Take by mouth daily.  . Vitamin Mixture (VITAMIN E COMPLETE) CAPS    Sig: Take by mouth daily.  . Cholecalciferol (VITAMIN D3) 2000 units TABS    Sig: Take 2,000 Units by mouth daily.  . Cyanocobalamin (B-12 PO)    Sig: Take by mouth. Take 1 tablet twice a week.  . ciclopirox (PENLAC) 8 % solution    Sig: Apply topically at bedtime. Apply over nail and surrounding skin. Apply daily over previous coat. After seven (7) days, may remove with alcohol and continue cycle.    Dispense:  6.6 mL    Refill:  0  . econazole nitrate 1 % cream    Sig: Apply topically daily. To affected skin on foot    Dispense:  85 g    Refill:  0

## 2015-12-10 NOTE — Assessment & Plan Note (Signed)
Refer to ENT for evaluation for OSA; not wearing the machine; do not drive when sleepy

## 2015-12-13 DIAGNOSIS — E559 Vitamin D deficiency, unspecified: Secondary | ICD-10-CM | POA: Insufficient documentation

## 2015-12-13 DIAGNOSIS — E538 Deficiency of other specified B group vitamins: Secondary | ICD-10-CM | POA: Insufficient documentation

## 2015-12-13 NOTE — Assessment & Plan Note (Signed)
Continue supplementation  ?

## 2015-12-13 NOTE — Assessment & Plan Note (Signed)
Controlled today; weight loss would also help; continue CCB

## 2015-12-28 ENCOUNTER — Telehealth: Payer: Self-pay | Admitting: Family Medicine

## 2015-12-28 NOTE — Telephone Encounter (Signed)
Pt called would like to know her blood type. Please call pt with this information. Thanks.

## 2015-12-30 NOTE — Telephone Encounter (Signed)
Called and let patient know what Dr. Sherie Don said. She stated she has given blood before so she is going to call the [facility]. While on the phone, patient asked about her referral to ENT. She stated that they tried to call her before but she asked for them to call her back because her phone was dying. I told the patient that she should be able to just call and schedule her appointment. I asked for her to give [place] a call if she has any problems.

## 2015-12-30 NOTE — Telephone Encounter (Signed)
Blood type is not a test I have ordered for her. If she has ever donated blood to the [facility], they would have that on file. It would also be in her mother-baby records if she has had children. We don't order ABO blood typing routinely.

## 2015-12-30 NOTE — Telephone Encounter (Signed)
Dr. Sherie Don, do we know this?

## 2016-04-19 ENCOUNTER — Telehealth: Payer: Self-pay | Admitting: Family Medicine

## 2016-04-19 MED ORDER — COLCHICINE 0.6 MG PO TABS: ORAL_TABLET | ORAL | Status: DC

## 2016-04-19 NOTE — Telephone Encounter (Signed)
Yes, Rx sent Avoid/limit seafood, alcohol which can precipitate flares We hope she feels better and can enjoy her vacation

## 2016-04-19 NOTE — Telephone Encounter (Signed)
PT IS ON VACATION AND SHE HAS HAD A FLARE UP WITH HER GOUT. WANTS TO KNOW IF [facility] COULD [facility] [place].  PHONE # IS [phone]

## 2016-04-26 ENCOUNTER — Telehealth: Payer: Self-pay | Admitting: Family Medicine

## 2016-04-26 NOTE — Telephone Encounter (Signed)
Requesting return call would not state what it was pertaining to

## 2016-04-26 NOTE — Telephone Encounter (Signed)
Pt notified it was pretaining to gout med that never reach her?

## 2016-04-26 NOTE — Telephone Encounter (Signed)
Asher MuirJamie, please contact patient and get more information

## 2016-05-09 ENCOUNTER — Telehealth: Payer: Self-pay | Admitting: Family Medicine

## 2016-05-09 MED ORDER — COLCHICINE 0.6 MG PO TABS: ORAL_TABLET | ORAL | Status: DC

## 2016-05-09 NOTE — Telephone Encounter (Signed)
I have no idea why colchicine three pills would need a prior auth; I would suggest she talk to pharmacy to find out how much; if acute gout flare, please pick up colchicine or go to urgent care

## 2016-05-09 NOTE — Telephone Encounter (Signed)
Patient checking status on medication. States it has been 2 hours and is demanding a call from her primacy doctor. She does not understand why she never received a response from her previous message earlier this month about her gout medication.

## 2016-05-09 NOTE — Telephone Encounter (Signed)
Patient never received prescription for her gout medication [place] [facility] [place] was [place]. She is now home and is asking that you please send [facility]. She is also asking that you inform her once this has been taken care of.

## 2016-05-09 NOTE — Telephone Encounter (Signed)
We are sorry if the medicine did not get to her pharmacy; she would have been welcome to call us back or have the pharmacy call us back; I did approve it, so I don't know why it didn't get there I'll send another Rx now to local pharmacy I have been seeing patients for the last two hours which is why I am getting to today's request now

## 2016-05-09 NOTE — Telephone Encounter (Signed)
Called patient to let her know that the [facility] [address] [place].

## 2016-05-09 NOTE — Telephone Encounter (Signed)
Did a prior auth waiting to hear back from ins, but sometimes that can be several days hopefully not in this case.  But is there anything else she can do in the meantime?

## 2016-05-09 NOTE — Telephone Encounter (Signed)
Patient went to pick up her prescription for gout and the pharmacy told her to contact our office. The insurance company is wanting to know why she is needing this prescription (sounds like it is needing a prior authorization). She is asking that you please let her know when she is able to pick up this prescription.

## 2016-05-11 ENCOUNTER — Other Ambulatory Visit: Payer: Self-pay | Admitting: Family Medicine

## 2016-05-11 MED ORDER — COLCHICINE 0.6 MG PO TABS: ORAL_TABLET | ORAL | Status: DC

## 2016-05-11 NOTE — Progress Notes (Signed)
Note from insurance company; sending brand name Colcrys

## 2016-05-23 ENCOUNTER — Ambulatory Visit (INDEPENDENT_AMBULATORY_CARE_PROVIDER_SITE_OTHER): Payer: BLUE CROSS/BLUE SHIELD | Admitting: Family Medicine

## 2016-05-23 ENCOUNTER — Encounter (INDEPENDENT_AMBULATORY_CARE_PROVIDER_SITE_OTHER): Payer: Self-pay

## 2016-05-23 ENCOUNTER — Emergency Department
Admission: EM | Admit: 2016-05-23 | Discharge: 2016-05-23 | Disposition: A | Discharge status: Home / Self Care | Payer: BLUE CROSS/BLUE SHIELD | Attending: Emergency Medicine | Admitting: Emergency Medicine

## 2016-05-23 ENCOUNTER — Encounter: Payer: Self-pay | Admitting: Family Medicine

## 2016-05-23 ENCOUNTER — Encounter: Payer: Self-pay | Admitting: Emergency Medicine

## 2016-05-23 VITALS — BP 142/90 | HR 92 | Temp 98.3°F | Resp 16 | Wt 276.0 lb

## 2016-05-23 DIAGNOSIS — Z7951 Long term (current) use of inhaled steroids: Secondary | ICD-10-CM | POA: Insufficient documentation

## 2016-05-23 DIAGNOSIS — H6091 Unspecified otitis externa, right ear: Secondary | ICD-10-CM | POA: Diagnosis not present

## 2016-05-23 DIAGNOSIS — Z791 Long term (current) use of non-steroidal anti-inflammatories (NSAID): Secondary | ICD-10-CM | POA: Diagnosis not present

## 2016-05-23 DIAGNOSIS — Z87891 Personal history of nicotine dependence: Secondary | ICD-10-CM | POA: Insufficient documentation

## 2016-05-23 DIAGNOSIS — Z79899 Other long term (current) drug therapy: Secondary | ICD-10-CM | POA: Insufficient documentation

## 2016-05-23 DIAGNOSIS — J45909 Unspecified asthma, uncomplicated: Secondary | ICD-10-CM | POA: Insufficient documentation

## 2016-05-23 DIAGNOSIS — R519 Headache, unspecified: Secondary | ICD-10-CM

## 2016-05-23 DIAGNOSIS — H9201 Otalgia, right ear: Secondary | ICD-10-CM | POA: Diagnosis present

## 2016-05-23 DIAGNOSIS — R51 Headache: Secondary | ICD-10-CM

## 2016-05-23 DIAGNOSIS — I10 Essential (primary) hypertension: Secondary | ICD-10-CM | POA: Diagnosis not present

## 2016-05-23 MED ORDER — HYDROCODONE-ACETAMINOPHEN 5-325 MG PO TABS: 1.0000 | ORAL_TABLET | ORAL | Status: DC | PRN

## 2016-05-23 MED ORDER — SULFAMETHOXAZOLE-TRIMETHOPRIM 800-160 MG PO TABS: 1.0000 | ORAL_TABLET | Freq: Two times a day (BID) | ORAL | Status: DC

## 2016-05-23 NOTE — Discharge Instructions (Signed)
Otitis Externa  Otitis externa is a bacterial or fungal infection of the outer ear canal. This is the area from the eardrum to the outside of the ear. Otitis externa is sometimes called "swimmer's ear."  CAUSES   Possible causes of infection include:  · Swimming in dirty water.  · Moisture remaining in the ear after swimming or bathing.  · Mild injury (trauma) to the ear.  · Objects stuck in the ear (foreign body).  · Cuts or scrapes (abrasions) on the outside of the ear.  SIGNS AND SYMPTOMS   The first symptom of infection is often itching in the ear canal. Later signs and symptoms may include swelling and redness of the ear canal, ear pain, and yellowish-white fluid (pus) coming from the ear. The ear pain may be worse when pulling on the earlobe.  DIAGNOSIS   Your health care provider will perform a physical exam. A sample of fluid may be taken from the ear and examined for bacteria or fungi.  TREATMENT   Antibiotic ear drops are often given for 10 to 14 days. Treatment may also include pain medicine or corticosteroids to reduce itching and swelling.  HOME CARE INSTRUCTIONS   · Apply antibiotic ear drops to the ear canal as prescribed by your health care provider.  · Take medicines only as directed by your health care provider.  · If you have diabetes, follow any additional treatment instructions from your health care provider.  · Keep all follow-up visits as directed by your health care provider.  PREVENTION   · Keep your ear dry. Use the corner of a towel to absorb water out of the ear canal after swimming or bathing.  · Avoid scratching or putting objects inside your ear. This can damage the ear canal or remove the protective wax that lines the canal. This makes it easier for bacteria and fungi to grow.  · Avoid swimming in lakes, polluted water, or poorly chlorinated pools.  · You may use ear drops made of rubbing alcohol and vinegar after swimming. Combine equal parts of white vinegar and alcohol in a bottle.  Put 3 or 4 drops into each ear after swimming.  SEEK MEDICAL CARE IF:   · You have a fever.  · Your ear is still red, swollen, painful, or draining pus after 3 days.  · Your redness, swelling, or pain gets worse.  · You have a severe headache.  · You have redness, swelling, pain, or tenderness in the area behind your ear.  MAKE SURE YOU:   · Understand these instructions.  · Will watch your condition.  · Will get help right away if you are not doing well or get worse.     This information is not intended to replace advice given to you by your health care provider. Make sure you discuss any questions you have with your health care provider.     Document Released: 10/31/2005 Document Revised: 11/21/2014 Document Reviewed: 11/17/2011  Elsevier Interactive Patient Education ©2016 Elsevier Inc[employer]    Ear Drops, Adult  You have been diagnosed with a condition requiring you to put drops of medicine into your outer ear.  HOME CARE INSTRUCTIONS   · Put drops in the affected ear as instructed. After putting the drops in, you will need to lie down with the affected ear facing up for ten minutes so the drops will remain in the ear canal and run down and fill the canal. Continue using the ear drops for   as long as directed by your health care provider.  · Prior to getting up, put a cotton ball gently in your ear canal. Leave enough of the cotton ball out so it can be easily removed. Do not attempt to push this down into the canal with a cotton-tipped swab or other instrument.  · Do not irrigate or wash out your ears if you have had a perforated eardrum or mastoid surgery, or unless instructed to do so by your health care provider.  · Keep appointments with your health care provider as instructed.  · Finish all medicine, or use for the length of time prescribed by your health care provider. Continue the drops even if your problem seems to be doing well after a couple days, or continue as instructed.  SEEK MEDICAL CARE IF:  · You  become worse or develop increasing pain.  · You notice any unusual drainage from your ear (particularly if the drainage has a bad smell).  · You develop hearing difficulties.  · You experience a serious form of dizziness in which you feel as if the room is spinning, and you feel nauseated (vertigo).  · The outside of your ear becomes red or swollen or both. This may be a sign of an allergic reaction.  MAKE SURE YOU:   · Understand these instructions.  · Will watch your condition.  · Will get help right away if you are not doing well or get worse.     This information is not intended to replace advice given to you by your health care provider. Make sure you discuss any questions you have with your health care provider.     Document Released: 10/25/2001 Document Revised: 11/21/2014 Document Reviewed: 05/28/2013  Elsevier Interactive Patient Education ©2016 Elsevier Inc.

## 2016-05-23 NOTE — ED Notes (Signed)
Internal and external ear pain and swelling.

## 2016-05-23 NOTE — ED Notes (Signed)
States she developed right ear pian on sat   [facility] and had a ear wick placed in ear which has fallen out now having increased pain with facial swelling

## 2016-05-23 NOTE — Progress Notes (Signed)
BP (!) 142/90   Pulse 92   Temp 98.3 F (36.8 C) (Oral)   Resp 16   Wt 276 lb (125.2 kg)   SpO2 92%   BMI 49.68 kg/m    Subjective:    Patient ID: Amber Weiss, female    DOB: [birth year], 55 y.o.   MRN: [id number]  HPI: Amber Weiss is a 55 y.o. female  Chief Complaint  Patient presents with  . Ear Pain    onset 3 days, severe pain and face and ear swollen went to urgent care was diagnosed with swimmers ear given ofloxacin   Patient is here with severe pain along the right side of the face with swelling of her face and ear Started on Saturday; she is seen here 48 hours later; she [facility], and they put her on ofloxacin drops in the ear, but she is getting worse Pain is a 9 out of 10 right now She was desperate and took two of her son's amoxicillin as well She is getting worsen on oral amoxicillin (two pills taken so far) and naproxen and ear drops of ofloxacin, 0.3% 10 drops into ear once a day for 7 days; no drops in the ear today though; closed up and won't go in Feels warm, but not feverish Whole side of face is swollen  Depression screen PHQ 2/9 05/23/2016  Decreased Interest 0  Down, Depressed, Hopeless 0  PHQ - 2 Score 0   Relevant past medical, surgical, family and social history reviewed Past Medical History:  Diagnosis Date  . Asthma   . Gout   . Hypertension   . OSA (obstructive sleep apnea)    Past Surgical History:  Procedure Laterality Date  . CESAREAN SECTION    . COLONOSCOPY  06/09/11   Social History  Substance Use Topics  . Smoking status: Former Smoker    Packs/day: 1.00    Years: 12.00    Types: Cigarettes    Quit date: 09/29/1992  . Smokeless tobacco: Never Used  . Alcohol use No   Interim medical history since last visit reviewed. Allergies and medications reviewed  Review of Systems Per HPI unless specifically indicated above     Objective:    BP (!) 142/90   Pulse 92   Temp 98.3 F (36.8  C) (Oral)   Resp 16   Wt 276 lb (125.2 kg)   SpO2 92%   BMI 49.68 kg/m   Wt Readings from Last 3 Encounters:  06/10/16 273 lb (123.8 kg)  05/23/16 265 lb (120.2 kg)  05/23/16 276 lb (125.2 kg)    Physical Exam  Constitutional: She appears well-developed and well-nourished.  HENT:  Mouth/Throat: Mucous membranes are not dry.  Obvious asymmetry and swelling of the right side of the face laterally; right external canal is swollen shut; very tender along right TMJ area, right ear, right mastoid with erythema  Lymphadenopathy:    She has cervical adenopathy (right side).  Psychiatric:  In obvious discomfort but cooperative      Assessment & Plan:   Problem List Items Addressed This Visit    None    Visit Diagnoses    Right sided facial pain    -  Primary   significant pain with swelling; refer to ER for evaluation; may need CT scan, ENT consultation; ddx includes mastoiditis, may need IV antibiotics   External otitis of right ear       refer to  ER      Follow up plan: No Follow-up on file.  An after-visit summary was printed and given to the patient at check-out.  Please see the patient instructions which may contain other information and recommendations beyond what is mentioned above in the assessment and plan.

## 2016-05-23 NOTE — ED Provider Notes (Signed)
[facility] Emergency Department Provider Note  ____________________________________________  Time seen: Approximately 3:57 PM  I have reviewed the triage vital signs and the nursing notes.   HISTORY  Chief Complaint Otalgia    HPI Amber Weiss is a 55 y.o. female since for evaluation of right ear pain. Patient states she was seen at the [facility] yesterday for external. She originally had any wick put in her ear which subsequently fell out. Has been using drops since yesterday complaining of continued pain and swelling to her right ear. She describes pain as 10 over 10 nonradiating with no relief with over-the-counter medication.   Past Medical History  Diagnosis Date  . Hypertension   . Asthma   . OSA (obstructive sleep apnea)   . Gout     Patient Active Problem List   Diagnosis Date Noted  . Vitamin D deficiency 12/13/2015  . Vitamin B12 deficiency 12/13/2015  . Needs flu shot 09/30/2015  . Fatigue 09/30/2015  . Morbid obesity (HCC) 09/30/2015  . Chronic gout 09/30/2015  . Preventative health care 09/30/2015  . Hypertension   . Asthma   . OSA (obstructive sleep apnea)     Past Surgical History  Procedure Laterality Date  . Colonoscopy  06/09/11  . Cesarean section      Current Outpatient Rx  Name  Route  Sig  Dispense  Refill  . albuterol (PROVENTIL HFA;VENTOLIN HFA) 108 (90 BASE) MCG/ACT inhaler   Inhalation   Inhale 2 puffs into the lungs every 6 (six) hours as needed for wheezing or shortness of breath.         Marland Kitchen amLODipine (NORVASC) 10 MG tablet   Oral   Take 1 tablet (10 mg total) by mouth daily.   90 tablet   3   . budesonide-formoterol (SYMBICORT) 160-4.5 MCG/ACT inhaler   Inhalation   Inhale 2 puffs into the lungs 2 (two) times daily.         . colchicine 0.6 MG tablet      COLCRYS -- Take two pills by mouth at onset of gout flare, then one pill one hour later; 3 pills total per flare   6 tablet   0    . Cyanocobalamin (B-12 PO)   Oral   Take by mouth. Take 1 tablet twice a week.         Marland Kitchen HYDROcodone-acetaminophen (NORCO) 5-325 MG tablet   Oral   Take 1-2 tablets by mouth every 4 (four) hours as needed for moderate pain.   15 tablet   0   . Misc Natural Products (TART CHERRY ADVANCED PO)   Oral   Take by mouth daily.         . naproxen (NAPROSYN) 375 MG tablet   Oral   Take 1 tablet (375 mg total) by mouth 2 (two) times daily.   20 tablet   0   . sulfamethoxazole-trimethoprim (BACTRIM DS,SEPTRA DS) 800-160 MG tablet   Oral   Take 1 tablet by mouth 2 (two) times daily.   20 tablet   0     Allergies Penicillins  Family History  Problem Relation Age of Onset  . Cancer Mother     breast  . Hypertension Mother   . Emphysema Mother   . Cancer Father     skin  . Diabetes Father   . Cancer Sister     bladder  . Heart disease Neg Hx   . Stroke Neg Hx  Social History Social History  Substance Use Topics  . Smoking status: Former Smoker -- 1.00 packs/day for 12 years    Types: Cigarettes    Quit date: 09/29/1992  . Smokeless tobacco: Never Used  . Alcohol Use: No    Review of Systems Constitutional: No fever/chills Eyes: No visual changes. ENT: No sore throat. Positive right ear pain.  Cardiovascular: Denies chest pain. Respiratory: Denies shortness of breath. Musculoskeletal: Negative for back pain. Skin: Negative for rash. Neurological: Negative for headaches, focal weakness or numbness.  10-point ROS otherwise negative.  ____________________________________________   PHYSICAL EXAM:  VITAL SIGNS: ED Triage Vitals  Enc Vitals Group     BP 05/23/16 1550 139/78 mmHg     Pulse Rate 05/23/16 1550 82     Resp 05/23/16 1550 18     Temp 05/23/16 1550 99.5 F (37.5 C)     Temp Source 05/23/16 1550 Oral     SpO2 05/23/16 1550 95 %     Weight 05/23/16 1550 265 lb (120.203 kg)     Height 05/23/16 1550 5\' 4"  (1.626 m)     Head Cir --       Peak Flow --      Pain Score 05/23/16 1551 10     Pain Loc --      Pain Edu? --      Excl. in GC? --     Constitutional: Alert and oriented. Well appearing and in no acute distress. Head: Atraumatic.Right ear with greenish discharge noted within the canal itself. Positive edema noted with no TM visibility. Positive tragal tenderness. Nose: No congestion/rhinnorhea. Neck: No stridor. Full range of motion no cervical adenopathy noted. Cardiovascular: Normal rate, regular rhythm. Grossly normal heart sounds.  Good peripheral circulation. Respiratory: Normal respiratory effort.  No retractions. Lungs CTAB. Neurologic:  Normal speech and language. No gross focal neurologic deficits are appreciated. No gait instability. Psychiatric: Mood and affect are normal. Speech and behavior are normal.  ____________________________________________   LABS (all labs ordered are listed, but only abnormal results are displayed)  Labs Reviewed - No data to display ____________________________________________    PROCEDURES  Procedure(s) performed: None  Critical Care performed: No  ____________________________________________   INITIAL IMPRESSION / ASSESSMENT AND PLAN / ED COURSE  Pertinent labs & imaging results that were available during my care of the patient were reviewed by me and considered in my medical decision making (see chart for details).  Right otitis externa. Continue ofloxacin drops. A wick reinserted into the right ear. Rx given for Bactrim DS twice a day and Vicodin 5/325. Follow-up with ENT if continues symptomology. ____________________________________________   FINAL CLINICAL IMPRESSION(S) / ED DIAGNOSES  Final diagnoses:  Otitis externa, right     This chart was dictated using voice recognition software/Dragon. Despite best efforts to proofread, errors can occur which can change the meaning. Any change was purely unintentional.   Evangeline Dakinharles M Beers, PA-C 05/23/16  1625  Myrna Blazeravid Matthew Schaevitz, MD 05/23/16 [phone]

## 2016-05-25 ENCOUNTER — Telehealth: Payer: Self-pay

## 2016-05-25 MED ORDER — OXYCODONE-ACETAMINOPHEN 5-325 MG PO TABS: 1.0000 | ORAL_TABLET | ORAL | Status: DC | PRN

## 2016-05-25 MED ORDER — CIPROFLOXACIN HCL 500 MG PO TABS: 500.0000 mg | ORAL_TABLET | Freq: Two times a day (BID) | ORAL | Status: DC

## 2016-05-25 MED ORDER — CIPROFLOXACIN-HYDROCORTISONE 0.2-1 % OT SUSP: 3.0000 [drp] | Freq: Two times a day (BID) | OTIC | Status: DC

## 2016-05-25 NOTE — Telephone Encounter (Signed)
Requesting return call. States she is running out of the medication and cannot go without it. She is asking for return call today from Dr Sherie DonLada

## 2016-05-25 NOTE — Telephone Encounter (Signed)
Pt called states they did nothing at ER but diagnose with swimmers ear and put another wick in which has since fell out.  She states is still in pain and still swollen wants to know what to do?

## 2016-05-25 NOTE — Telephone Encounter (Signed)
I spoke with patient; she is in significant pain; she went to ER; no imaging; they placed wick and it fell out; she doesn't think she's much better; husband thinks maybe a tiny hole in the ear opening back up but not really; out of pain medicine, not a pain pill taker but this is painful; I'll get her pain med, daughter will pick up tonight; I'll get her on cipro orally BID and new ear drops BID (she was doing ear drops once a day up to now); if getting worse, go to ER

## 2016-05-26 ENCOUNTER — Other Ambulatory Visit: Payer: Self-pay | Admitting: Family Medicine

## 2016-05-26 MED ORDER — CIPROFLOXACIN HCL 0.2 % OT SOLN: 0.2000 mL | Freq: Two times a day (BID) | OTIC | Status: DC

## 2016-06-10 ENCOUNTER — Ambulatory Visit
Admission: RE | Admit: 2016-06-10 | Discharge: 2016-06-10 | Disposition: A | Discharge status: Home / Self Care | Payer: BLUE CROSS/BLUE SHIELD | Source: Ambulatory Visit | Attending: Family Medicine | Admitting: Family Medicine

## 2016-06-10 ENCOUNTER — Ambulatory Visit (INDEPENDENT_AMBULATORY_CARE_PROVIDER_SITE_OTHER): Payer: BLUE CROSS/BLUE SHIELD | Admitting: Family Medicine

## 2016-06-10 ENCOUNTER — Encounter: Payer: Self-pay | Admitting: Family Medicine

## 2016-06-10 DIAGNOSIS — M1A29X0 Drug-induced chronic gout, multiple sites, without tophus (tophi): Secondary | ICD-10-CM

## 2016-06-10 DIAGNOSIS — R400 Somnolence: Secondary | ICD-10-CM

## 2016-06-10 DIAGNOSIS — E786 Lipoprotein deficiency: Secondary | ICD-10-CM | POA: Insufficient documentation

## 2016-06-10 DIAGNOSIS — E559 Vitamin D deficiency, unspecified: Secondary | ICD-10-CM | POA: Diagnosis not present

## 2016-06-10 DIAGNOSIS — G4733 Obstructive sleep apnea (adult) (pediatric): Secondary | ICD-10-CM | POA: Diagnosis not present

## 2016-06-10 DIAGNOSIS — G4710 Hypersomnia, unspecified: Secondary | ICD-10-CM

## 2016-06-10 DIAGNOSIS — I10 Essential (primary) hypertension: Secondary | ICD-10-CM

## 2016-06-10 DIAGNOSIS — E538 Deficiency of other specified B group vitamins: Secondary | ICD-10-CM | POA: Diagnosis not present

## 2016-06-10 DIAGNOSIS — Z5181 Encounter for therapeutic drug level monitoring: Secondary | ICD-10-CM | POA: Diagnosis not present

## 2016-06-10 LAB — COMPLETE METABOLIC PANEL WITH GFR
ALBUMIN: 4 g/dL (ref 3.6–5.1)
ALK PHOS: 90 U/L (ref 33–130)
ALT: 17 U/L (ref 6–29)
AST: 15 U/L (ref 10–35)
BUN: 15 mg/dL (ref 7–25)
CO2: 29 mmol/L (ref 20–31)
Calcium: 9.1 mg/dL (ref 8.6–10.4)
Chloride: 105 mmol/L (ref 98–110)
Creat: 0.99 mg/dL (ref 0.50–1.05)
GFR, EST NON AFRICAN AMERICAN: 64 mL/min (ref >=60)
GFR, Est African American: 74 mL/min (ref >=60)
GLUCOSE: 102 mg/dL — AB (ref 65–99)
POTASSIUM: 3.9 mmol/L (ref 3.5–5.3)
SODIUM: 145 mmol/L (ref 135–146)
Total Bilirubin: 0.6 mg/dL (ref 0.2–1.2)
Total Protein: 7.2 g/dL (ref 6.1–8.1)

## 2016-06-10 LAB — LIPID PANEL
CHOL/HDL RATIO: 3.9 ratio (ref <=5.0)
Cholesterol: 150 mg/dL (ref 125–200)
HDL: 38 mg/dL — AB (ref >=46)
LDL Cholesterol: 70 mg/dL (ref <130)
Triglycerides: 211 mg/dL — ABNORMAL HIGH (ref <150)
VLDL: 42 mg/dL — ABNORMAL HIGH (ref <30)

## 2016-06-10 LAB — URIC ACID: URIC ACID, SERUM: 10.1 mg/dL — AB (ref 2.5–7.0)

## 2016-06-10 LAB — TSH: TSH: 2.94 mIU/L

## 2016-06-10 MED ORDER — AMPHETAMINE-DEXTROAMPHETAMINE 10 MG PO TABS: 10.0000 mg | ORAL_TABLET | Freq: Two times a day (BID) | ORAL | 0 refills | Status: DC

## 2016-06-10 NOTE — Assessment & Plan Note (Signed)
Fluctuating; check TSH; she is not interested in the bariatric surgery at this time; counseling recommeded

## 2016-06-10 NOTE — Assessment & Plan Note (Signed)
Check level today 

## 2016-06-10 NOTE — Assessment & Plan Note (Signed)
Seeing ENT soon; do not drive if somnolent

## 2016-06-10 NOTE — Assessment & Plan Note (Signed)
Well-controlled; work on weight loss; take med religiously especially with new stimulant, risk of heart attack and stroke discussed; DASH guidelines encouraged

## 2016-06-10 NOTE — Patient Instructions (Addendum)
Please do eat yogurt daily or take a probiotic daily for the next month or two We want to replace the healthy germs in the gut If you notice foul, watery diarrhea in the next two months, schedule an appointment RIGHT AWAY  Never drive or operate machinery if tired or drowsy  Try to use PLAIN allergy medicine without the decongestant Avoid: phenylephrine, phenylpropanolamine, and pseudoephredine  Start the new medicine for alertness We'll have you see a neurologist ASAP We'll still have you see the ENT about probable sleep apnea  Your goal blood pressure is less than 140 mmHg on top. Try to follow the DASH guidelines (DASH stands for Dietary Approaches to Stop Hypertension) Try to limit the sodium in your diet.  Ideally, consume less than 1.5 grams (less than 1,500mg ) per day. Do not add salt when cooking or at the table.  Check the sodium amount on labels when shopping, and choose items lower in sodium when given a choice. Avoid or limit foods that already contain a lot of sodium. Eat a diet rich in fruits and vegetables and whole grains.  Let's get labs and xray today If you have not heard anything from my staff in a week about any orders/referrals/studies from today, please contact us here to follow-up [phone]    Gout Gout is an inflammatory arthritis caused by a buildup of uric acid crystals in the joints. Uric acid is a chemical that is normally present in the blood. When the level of uric acid in the blood is too high it can form crystals that deposit in your joints and tissues. This causes joint redness, soreness, and swelling (inflammation). Repeat attacks are common. Over time, uric acid crystals can form into masses (tophi) near a joint, destroying bone and causing disfigurement. Gout is treatable and often preventable. CAUSES  The disease begins with elevated levels of uric acid in the blood. Uric acid is produced by your body when it breaks down a naturally found substance  called purines. Certain foods you eat, such as meats and fish, contain high amounts of purines. Causes of an elevated uric acid level include:  Being passed down from parent to child (heredity).  Diseases that cause increased uric acid production (such as obesity, psoriasis, and certain cancers).  Excessive alcohol use.  Diet, especially diets rich in meat and seafood.  Medicines, including certain cancer-fighting medicines (chemotherapy), water pills (diuretics), and aspirin.  Chronic kidney disease. The kidneys are no longer able to remove uric acid well.  Problems with metabolism. Conditions strongly associated with gout include:  Obesity.  High blood pressure.  High cholesterol.  Diabetes. Not everyone with elevated uric acid levels gets gout. It is not understood why some people get gout and others do not. Surgery, joint injury, and eating too much of certain foods are some of the factors that can lead to gout attacks. SYMPTOMS   An attack of gout comes on quickly. It causes intense pain with redness, swelling, and warmth in a joint.  Fever can occur.  Often, only one joint is involved. Certain joints are more commonly involved:  Base of the big toe.  Knee.  Ankle.  Wrist.  Finger. Without treatment, an attack usually goes away in a few days to weeks. Between attacks, you usually will not have symptoms, which is different from many other forms of arthritis. DIAGNOSIS  Your caregiver will suspect gout based on your symptoms and exam. In some cases, tests may be recommended. The tests may  include:  Blood tests.  Urine tests.  X-rays.  Joint fluid exam. This exam requires a needle to remove fluid from the joint (arthrocentesis). Using a microscope, gout is confirmed when uric acid crystals are seen in the joint fluid. TREATMENT  There are two phases to gout treatment: treating the sudden onset (acute) attack and preventing attacks (prophylaxis).  Treatment of  an Acute Attack.  Medicines are used. These include anti-inflammatory medicines or steroid medicines.  An injection of steroid medicine into the affected joint is sometimes necessary.  The painful joint is rested. Movement can worsen the arthritis.  You may use warm or cold treatments on painful joints, depending which works best for you.  Treatment to Prevent Attacks.  If you suffer from frequent gout attacks, your caregiver may advise preventive medicine. These medicines are started after the acute attack subsides. These medicines either help your kidneys eliminate uric acid from your body or decrease your uric acid production. You may need to stay on these medicines for a very long time.  The early phase of treatment with preventive medicine can be associated with an increase in acute gout attacks. For this reason, during the first few months of treatment, your caregiver may also advise you to take medicines usually used for acute gout treatment. Be sure you understand your caregiver's directions. Your caregiver may make several adjustments to your medicine dose before these medicines are effective.  Discuss dietary treatment with your caregiver or dietitian. Alcohol and drinks high in sugar and fructose and foods such as meat, poultry, and seafood can increase uric acid levels. Your caregiver or dietitian can advise you on drinks and foods that should be limited. HOME CARE INSTRUCTIONS   Do not take aspirin to relieve pain. This raises uric acid levels.  Only take over-the-counter or prescription medicines for pain, discomfort, or fever as directed by your caregiver.  Rest the joint as much as possible. When in bed, keep sheets and blankets off painful areas.  Keep the affected joint raised (elevated).  Apply warm or cold treatments to painful joints. Use of warm or cold treatments depends on which works best for you.  Use crutches if the painful joint is in your leg.  Drink enough  fluids to keep your urine clear or pale yellow. This helps your body get rid of uric acid. Limit alcohol, sugary drinks, and fructose drinks.  Follow your dietary instructions. Pay careful attention to the amount of protein you eat. Your daily diet should emphasize fruits, vegetables, whole grains, and fat-free or low-fat milk products. Discuss the use of coffee, vitamin C, and cherries with your caregiver or dietitian. These may be helpful in lowering uric acid levels.  Maintain a healthy body weight. SEEK MEDICAL CARE IF:   You develop diarrhea, vomiting, or any side effects from medicines.  You do not feel better in 24 hours, or you are getting worse. SEEK IMMEDIATE MEDICAL CARE IF:   Your joint becomes suddenly more tender, and you have chills or a fever. MAKE SURE YOU:   Understand these instructions.  Will watch your condition.  Will get help right away if you are not doing well or get worse.   This information is not intended to replace advice given to you by your health care provider. Make sure you discuss any questions you have with your health care provider.   Document Released: 10/28/2000 Document Revised: 11/21/2014 Document Reviewed: 06/13/2012 Elsevier Interactive Patient Education [employer]

## 2016-06-10 NOTE — Assessment & Plan Note (Addendum)
Concern for narcolepsy, in addition to possible sleep apnea; will put her on adderall 10 mg BID after discussion of risks (heart attack, stroke); do NOT drive or operate machinery if tired; refer to neurologist ASAP

## 2016-06-10 NOTE — Assessment & Plan Note (Signed)
Check uric acid today; will get foot xray

## 2016-06-10 NOTE — Progress Notes (Signed)
BP 126/80   Pulse 95   Temp 98.3 F (36.8 C) (Oral)   Resp 14   Wt 273 lb (123.8 kg)   SpO2 95%   BMI 46.86 kg/m    Subjective:    Patient ID: Amber Weiss, female    DOB: January 07, 1961, 55 y.o.   MRN: [id number]  HPI: Amber Weiss is a 55 y.o. female  Chief Complaint  Patient presents with  . Follow-up   Right ear is much better; no side effects from the antibiotics except for a little itching; no diarrhea or loose stools; she did not finish out the antibiotics though, still a little drainage from right ear; facial edema has resolved  Gout; right 4th toe and it went crazy; another flare in June; the 4th toe on the right foot is deformed  B12 was low last November at 366; energy level is the same  Low HDL; 38 in November; exercise "comes and goes"  Falls asleep sitting up; had an appt for possible sleep apnea; couldn't go earlier and has appt on August 14th; she was referred for possible sleep apnea in January; has fallen asleep the middle of a conversation  Vitamin D was 18.7 in the fall; recheck today; not taking any now  Obesity; having to eat out and doesn't have a kitchen right now; that will be corrected soon; she was referred to bariatric surgeon in January but she says she is going back and forth about that; some people she knows gained their weight gain back; she might be the one who would gain her weight back  Depression screen PHQ 2/9 05/23/2016  Decreased Interest 0  Down, Depressed, Hopeless 0  PHQ - 2 Score 0   Relevant past medical, surgical, family and social history reviewed Past Medical History:  Diagnosis Date  . Asthma   . Gout   . Hypertension   . OSA (obstructive sleep apnea)    Past Surgical History:  Procedure Laterality Date  . CESAREAN SECTION    . COLONOSCOPY  06/09/11   Family History  Problem Relation Age of Onset  . Cancer Mother     breast  . Hypertension Mother   . Emphysema Mother   . Cancer Father     skin  . Diabetes  Father   . Cancer Sister     bladder  . Heart disease Neg Hx   . Stroke Neg Hx    Social History  Substance Use Topics  . Smoking status: Former Smoker    Packs/day: 1.00    Years: 12.00    Types: Cigarettes    Quit date: 09/29/1992  . Smokeless tobacco: Never Used  . Alcohol use No    Interim medical history since last visit reviewed. Allergies and medications reviewed  Review of Systems Per HPI unless specifically indicated above     Objective:    BP 126/80   Pulse 95   Temp 98.3 F (36.8 C) (Oral)   Resp 14   Wt 273 lb (123.8 kg)   SpO2 95%   BMI 46.86 kg/m   Wt Readings from Last 3 Encounters:  06/10/16 273 lb (123.8 kg)  05/23/16 265 lb (120.2 kg)  05/23/16 276 lb (125.2 kg)    Physical Exam  Constitutional: She appears well-developed and well-nourished. No distress.  HENT:  Head: Normocephalic and atraumatic.  Right Ear: Hearing, external ear and ear canal normal.  Left Ear: Hearing, external ear and ear canal normal.  Mouth/Throat: Oropharynx  is clear and moist.  Facial asymmetry and edema on the right side has resolved  Eyes: EOM are normal. No scleral icterus.  Neck: No thyromegaly present.  Cardiovascular: Normal rate, regular rhythm and normal heart sounds.   No murmur heard. Pulmonary/Chest: Effort normal and breath sounds normal. No respiratory distress. She has no wheezes.  Abdominal: Soft. Bowel sounds are normal. She exhibits no distension.  Musculoskeletal: Normal range of motion. She exhibits no edema.       Right foot: There is deformity (4th toe swollen, no erythema).  Neurological: She is alert. She exhibits normal muscle tone.  Skin: Skin is warm and dry. She is not diaphoretic. No pallor.  Psychiatric: She has a normal mood and affect. Her behavior is normal. Judgment and thought content normal.    Results for orders placed or performed in visit on 09/30/15  CBC with Differential/Platelet  Result Value Ref Range   WBC 6.6 3.4 -  10.8 x10E3/uL   RBC 4.14 3.77 - 5.28 x10E6/uL   Hemoglobin 12.4 11.1 - 15.9 g/dL   Hematocrit 46.9 62.9 - 46.6 %   MCV 92 79 - 97 fL   MCH 30.0 26.6 - 33.0 pg   MCHC 32.6 31.5 - 35.7 g/dL   RDW 52.8 41.3 - 24.4 %   Platelets 180 150 - 379 x10E3/uL   Neutrophils 68 %   Lymphs 20 %   Monocytes 5 %   Eos 6 %   Basos 1 %   Neutrophils Absolute 4.5 1.4 - 7.0 x10E3/uL   Lymphocytes Absolute 1.3 0.7 - 3.1 x10E3/uL   Monocytes Absolute 0.3 0.1 - 0.9 x10E3/uL   EOS (ABSOLUTE) 0.4 0.0 - 0.4 x10E3/uL   Basophils Absolute 0.0 0.0 - 0.2 x10E3/uL   Immature Granulocytes 0 %   Immature Grans (Abs) 0.0 0.0 - 0.1 x10E3/uL  Lipid Panel w/o Chol/HDL Ratio  Result Value Ref Range   Cholesterol, Total 156 100 - 199 mg/dL   Triglycerides 010 (H) 0 - 149 mg/dL   HDL 38 (L) >27 mg/dL   VLDL Cholesterol Cal 36 5 - 40 mg/dL   LDL Calculated 82 0 - 99 mg/dL  Comprehensive metabolic panel  Result Value Ref Range   Glucose 84 65 - 99 mg/dL   BUN 20 6 - 24 mg/dL   Creatinine, Ser 2.53 0.57 - 1.00 mg/dL   GFR calc non Af Amer 83 >59 mL/min/1.73   GFR calc Af Amer 95 >59 mL/min/1.73   BUN/Creatinine Ratio 25 (H) 9 - 23   Sodium 143 136 - 144 mmol/L   Potassium 4.4 3.5 - 5.2 mmol/L   Chloride 101 97 - 106 mmol/L   CO2 29 18 - 29 mmol/L   Calcium 9.0 8.7 - 10.2 mg/dL   Total Protein 7.1 6.0 - 8.5 g/dL   Albumin 4.3 3.5 - 5.5 g/dL   Globulin, Total 2.8 1.5 - 4.5 g/dL   Albumin/Globulin Ratio 1.5 1.1 - 2.5   Bilirubin Total 0.6 0.0 - 1.2 mg/dL   Alkaline Phosphatase 86 39 - 117 IU/L   AST 19 0 - 40 IU/L   ALT 20 0 - 32 IU/L  VITAMIN D 25 Hydroxy (Vit-D Deficiency, Fractures)  Result Value Ref Range   Vit D, 25-Hydroxy 18.7 (L) 30.0 - 100.0 ng/mL  TSH  Result Value Ref Range   TSH 3.740 0.450 - 4.500 uIU/mL  Vitamin B12  Result Value Ref Range   Vitamin B-12 366 211 - 946 pg/mL  Assessment & Plan:   Problem List Items Addressed This Visit      Cardiovascular and Mediastinum    Hypertension (Chronic)    Well-controlled; work on weight loss; take med religiously especially with new stimulant, risk of heart attack and stroke discussed; DASH guidelines encouraged        Respiratory   OSA (obstructive sleep apnea)    Seeing ENT soon; do not drive if somnolent        Digestive   Vitamin B12 deficiency    Check level today      Relevant Orders   Vitamin B12     Other   Vitamin D deficiency    Check level today      Relevant Orders   VITAMIN D 25 Hydroxy (Vit-D Deficiency, Fractures)   Morbid obesity (HCC) (Chronic)    Fluctuating; check TSH; she is not interested in the bariatric surgery at this time; counseling recommeded      Relevant Medications   amphetamine-dextroamphetamine (ADDERALL) 10 MG tablet   Other Relevant Orders   TSH   Low HDL (under 40)   Relevant Orders   Lipid panel   Encounter for medication monitoring   Relevant Orders   COMPLETE METABOLIC PANEL WITH GFR   Daytime somnolence    Concern for narcolepsy, in addition to possible sleep apnea; will put her on adderall 10 mg BID after discussion of risks (heart attack, stroke); do NOT drive or operate machinery if tired; refer to neurologist ASAP      Relevant Orders   Ambulatory referral to Neurology   Chronic gout    Check uric acid today; will get foot xray      Relevant Orders   Uric acid   DG Foot Complete Right (Completed)    Other Visit Diagnoses   None.     Follow up plan: No Follow-up on file.  An after-visit summary was printed and given to the patient at check-out.  Please see the patient instructions which may contain other information and recommendations beyond what is mentioned above in the assessment and plan.  Meds ordered this encounter  Medications  . amphetamine-dextroamphetamine (ADDERALL) 10 MG tablet    Sig: Take 1 tablet (10 mg total) by mouth 2 (two) times daily. 1st pill first thing in the morning, 2nd pill in the early afternoon     Dispense:  60 tablet    Refill:  0   Orders Placed This Encounter  Procedures  . DG Foot Complete Right  . Uric acid  . COMPLETE METABOLIC PANEL WITH GFR  . Lipid panel  . VITAMIN D 25 Hydroxy (Vit-D Deficiency, Fractures)  . Vitamin B12  . TSH  . Ambulatory referral to Neurology

## 2016-06-11 ENCOUNTER — Other Ambulatory Visit: Payer: Self-pay | Admitting: Family Medicine

## 2016-06-11 DIAGNOSIS — M1A09X0 Idiopathic chronic gout, multiple sites, without tophus (tophi): Secondary | ICD-10-CM

## 2016-06-11 DIAGNOSIS — Z5181 Encounter for therapeutic drug level monitoring: Secondary | ICD-10-CM

## 2016-06-11 LAB — VITAMIN D 25 HYDROXY (VIT D DEFICIENCY, FRACTURES): VIT D 25 HYDROXY: 24 ng/mL — AB (ref 30–100)

## 2016-06-11 LAB — VITAMIN B12: Vitamin B-12: 446 pg/mL (ref 200–1100)

## 2016-06-11 MED ORDER — ALLOPURINOL 100 MG PO TABS: 100.0000 mg | ORAL_TABLET | Freq: Every day | ORAL | 0 refills | Status: DC

## 2016-06-11 NOTE — Assessment & Plan Note (Signed)
Monitor creatinine on allopurinol

## 2016-07-21 ENCOUNTER — Telehealth: Payer: Self-pay | Admitting: Family Medicine

## 2016-07-22 NOTE — Telephone Encounter (Signed)
She is now seeing Dr. Theora MasterZachary Potter for the daytime somnolence so we'll have her get any medications for this condition from his office; he may be adjusting dose or have switched to something else entirely, so please have her contact his office; thank you

## 2016-07-25 NOTE — Telephone Encounter (Signed)
Read previous message to the patient regarding her contacting Dr. Malvin JohnsPotter office.  Patient is still not understanding why she needs to call his office and would like a call back from Dr. Sherie DonLada.

## 2016-07-25 NOTE — Telephone Encounter (Signed)
Pt.notified

## 2016-09-04 ENCOUNTER — Other Ambulatory Visit: Payer: Self-pay | Admitting: Family Medicine

## 2016-09-22 ENCOUNTER — Ambulatory Visit: Payer: BLUE CROSS/BLUE SHIELD | Attending: Neurology

## 2016-09-22 DIAGNOSIS — Z79899 Other long term (current) drug therapy: Secondary | ICD-10-CM | POA: Insufficient documentation

## 2016-09-22 DIAGNOSIS — I10 Essential (primary) hypertension: Secondary | ICD-10-CM | POA: Diagnosis not present

## 2016-09-22 DIAGNOSIS — G4733 Obstructive sleep apnea (adult) (pediatric): Secondary | ICD-10-CM | POA: Insufficient documentation

## 2016-09-22 DIAGNOSIS — G4710 Hypersomnia, unspecified: Secondary | ICD-10-CM | POA: Diagnosis present

## 2016-09-22 DIAGNOSIS — Z880 Allergy status to penicillin: Secondary | ICD-10-CM | POA: Diagnosis not present

## 2016-10-05 ENCOUNTER — Other Ambulatory Visit: Payer: Self-pay | Admitting: Family Medicine

## 2017-04-06 ENCOUNTER — Encounter: Payer: Self-pay | Admitting: Family Medicine

## 2017-04-06 ENCOUNTER — Ambulatory Visit (INDEPENDENT_AMBULATORY_CARE_PROVIDER_SITE_OTHER): Payer: BLUE CROSS/BLUE SHIELD | Admitting: Family Medicine

## 2017-04-06 VITALS — BP 136/78 | HR 82 | Temp 98.1°F | Resp 16 | Wt 245.5 lb

## 2017-04-06 DIAGNOSIS — G4733 Obstructive sleep apnea (adult) (pediatric): Secondary | ICD-10-CM

## 2017-04-06 DIAGNOSIS — R634 Abnormal weight loss: Secondary | ICD-10-CM

## 2017-04-06 DIAGNOSIS — Z1239 Encounter for other screening for malignant neoplasm of breast: Secondary | ICD-10-CM

## 2017-04-06 DIAGNOSIS — Z1231 Encounter for screening mammogram for malignant neoplasm of breast: Secondary | ICD-10-CM

## 2017-04-06 DIAGNOSIS — I10 Essential (primary) hypertension: Secondary | ICD-10-CM

## 2017-04-06 DIAGNOSIS — R109 Unspecified abdominal pain: Secondary | ICD-10-CM | POA: Diagnosis not present

## 2017-04-06 DIAGNOSIS — M1A09X0 Idiopathic chronic gout, multiple sites, without tophus (tophi): Secondary | ICD-10-CM

## 2017-04-06 DIAGNOSIS — R110 Nausea: Secondary | ICD-10-CM

## 2017-04-06 LAB — CBC WITH DIFFERENTIAL/PLATELET
BASOS ABS: 70 {cells}/uL (ref 0–200)
BASOS PCT: 1 %
Eosinophils Absolute: 420 cells/uL (ref 15–500)
Eosinophils Relative: 6 %
HCT: 39 % (ref 35.0–45.0)
HEMOGLOBIN: 12.7 g/dL (ref 11.7–15.5)
LYMPHS ABS: 1820 {cells}/uL (ref 850–3900)
Lymphocytes Relative: 26 %
MCH: 28.8 pg (ref 27.0–33.0)
MCHC: 32.6 g/dL (ref 32.0–36.0)
MCV: 88.4 fL (ref 80.0–100.0)
MPV: 10.7 fL (ref 7.5–12.5)
Monocytes Absolute: 350 cells/uL (ref 200–950)
Monocytes Relative: 5 %
NEUTROS ABS: 4340 {cells}/uL (ref 1500–7800)
Neutrophils Relative %: 62 %
PLATELETS: 211 10*3/uL (ref 140–400)
RBC: 4.41 MIL/uL (ref 3.80–5.10)
RDW: 15.8 % — ABNORMAL HIGH (ref 11.0–15.0)
WBC: 7 10*3/uL (ref 3.8–10.8)

## 2017-04-06 LAB — COMPLETE METABOLIC PANEL WITH GFR
ALBUMIN: 4.6 g/dL (ref 3.6–5.1)
ALK PHOS: 78 U/L (ref 33–130)
ALT: 26 U/L (ref 6–29)
AST: 25 U/L (ref 10–35)
BILIRUBIN TOTAL: 0.8 mg/dL (ref 0.2–1.2)
BUN: 22 mg/dL (ref 7–25)
CO2: 26 mmol/L (ref 20–31)
Calcium: 9.5 mg/dL (ref 8.6–10.4)
Chloride: 104 mmol/L (ref 98–110)
Creat: 1.01 mg/dL (ref 0.50–1.05)
GFR, Est African American: 72 mL/min (ref >=60)
GFR, Est Non African American: 63 mL/min (ref >=60)
GLUCOSE: 89 mg/dL (ref 65–99)
Potassium: 4 mmol/L (ref 3.5–5.3)
SODIUM: 141 mmol/L (ref 135–146)
TOTAL PROTEIN: 7.8 g/dL (ref 6.1–8.1)

## 2017-04-06 NOTE — Assessment & Plan Note (Signed)
Controlled today 

## 2017-04-06 NOTE — Progress Notes (Signed)
BP 136/78   Pulse 82   Temp 98.1 F (36.7 C) (Oral)   Resp 16   Wt 245 lb 8 oz (111.4 kg)   SpO2 97%   BMI 42.14 kg/m    Subjective:    Patient ID: Amber Weiss, female    DOB: Feb 01, 1961, 56 y.o.   MRN: [id number]  HPI: Amber Weiss is a 56 y.o. female  Chief Complaint  Patient presents with  . Weight Loss    31pds since about 8 weeks, started a new business, and has been eating less and cutting back on sodas.  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 Eating less because she has been busy; not bored to death Lost 30 pounds over 8 weeks Not going to the gym Was drinking two liters of soft drinks a day before she cut those out Her portions are smaller Her mother had "everything wrong with her, was always very thin" Patient had one abnormal thyroid in the past, but recheck normal She has not felt jittery or shaky, not feeling anxious; has always had palpitations; saw cardiologist years ago and her rhythm would be off but nothing to do about it she says; no medicine Sleep apnea; seeing neurologist; on adderall 5 mg BID; not taking afternoon dose very often adderall nothing new We talked about cancer as cause of weight loss; no new cough, not worse, no blood Having some night sweats, every now and then, but has always been very hot natured; keeps fan on the bed No blood in the stool or urine Had one colonoscopy at age 11; no polyps; no fam hx of colon cancer Was feeling a little bit of nausea, stomach feels funny across the upper abdomen; normal stool color No loss of appetite No fam hx of pancreatic or gastric cancer Sister had bladder cancer About 7 years ago, was seeing GYN, had something wrong with menstrual cycles for 6 weeks; they did an [place] then; he retired, never went back Vitamin D was a little low last summer; 24; quit taking the supplement  Depression screen [facility] 2/9 04/06/2017 05/23/2016  Decreased Interest  0 0  Down, Depressed, Hopeless 0 0  PHQ - 2 Score 0 0    Relevant past medical, surgical, family and social history reviewed Past Medical History:  Diagnosis Date  . Asthma   . Gout   . Hypertension   . OSA (obstructive sleep apnea)    Past Surgical History:  Procedure Laterality Date  . CESAREAN SECTION    . COLONOSCOPY  06/09/11   Family History  Problem Relation Age of Onset  . Cancer Mother        breast  . Hypertension Mother   . Emphysema Mother   . Cancer Father        skin  . Diabetes Father   . Cancer Sister        bladder  . Heart disease Neg Hx   . Stroke Neg Hx    Social History   Social History  . Marital status: Married    Spouse name: N/A  . Number of children: N/A  . Years of education: N/A   Occupational History  . Not on file.   Social History Main Topics  . Smoking status: Former Smoker    Packs/day: 1.00    Years: 12.00    Types: Cigarettes    Quit date:  09/29/1992  . Smokeless tobacco: Never Used  . Alcohol use No  . Drug use: No  . Sexual activity: Not on file   Other Topics Concern  . Not on file   Social History Narrative  . No narrative on file   Interim medical history since last visit reviewed. Allergies and medications reviewed  Review of Systems Per HPI unless specifically indicated above     Objective:    BP 136/78   Pulse 82   Temp 98.1 F (36.7 C) (Oral)   Resp 16   Wt 245 lb 8 oz (111.4 kg)   SpO2 97%   BMI 42.14 kg/m   Wt Readings from Last 3 Encounters:  04/06/17 245 lb 8 oz (111.4 kg)  06/10/16 273 lb (123.8 kg)  05/23/16 265 lb (120.2 kg)    Physical Exam  Constitutional: She appears well-developed and well-nourished. No distress.  HENT:  Head: Normocephalic and atraumatic.  Eyes: EOM are normal. No scleral icterus.  Neck: No thyromegaly present.  Cardiovascular: Normal rate, regular rhythm and normal heart sounds.   No murmur heard. Pulmonary/Chest: Effort normal and breath sounds normal.  No respiratory distress. She has no wheezes.  Abdominal: Soft. Bowel sounds are normal. She exhibits no distension.  Musculoskeletal: Normal range of motion. She exhibits no edema.  Neurological: She is alert. She exhibits normal muscle tone.  Skin: Skin is warm and dry. She is not diaphoretic. No pallor.  Psychiatric: She has a normal mood and affect. Her behavior is normal. Judgment and thought content normal.      Assessment & Plan:   Problem List Items Addressed This Visit      Cardiovascular and Mediastinum   Hypertension (Chronic)    Controlled today        Respiratory   OSA (obstructive sleep apnea)    Using CPAP, followed by neurologist        Other   Chronic gout    Elevated uric acid, recheck today; suggested tart cherry juice      Relevant Orders   Uric acid (Completed)    Other Visit Diagnoses    Weight loss    -  Primary   unintended; check thyroid function, malignancy work-up started today, with further studies soon as dictated   Relevant Orders   CBC with Differential/Platelet (Completed)   COMPLETE METABOLIC PANEL WITH GFR (Completed)   Amylase (Completed)   Lipase (Completed)   T4, free (Completed)   TSH (Completed)   Screening for breast cancer       Relevant Orders   MM Digital Screening   Abdominal discomfort       start with pelvic US, then proceed to abd/pelvic CT scan if needed; check labs as well   Relevant Orders   CBC with Differential/Platelet (Completed)   COMPLETE METABOLIC PANEL WITH GFR (Completed)   Amylase (Completed)   Lipase (Completed)   US Pelvis Complete   US Transvaginal Non-OB   Urinalysis w microscopic + reflex cultur (Completed)   Nausea       with weight loss and abd discomfort, will start with pelvic US to r/o ovarian malignancy   Relevant Orders   COMPLETE METABOLIC PANEL WITH GFR (Completed)   Amylase (Completed)   Lipase (Completed)   US Pelvis Complete   US Transvaginal Non-OB      Follow up plan: No  Follow-up on file.  An after-visit summary was printed and given to the patient at check-out.  Please see the patient  instructions which may contain other information and recommendations beyond what is mentioned above in the assessment and plan.  Meds ordered this encounter  Medications  . amphetamine-dextroamphetamine (ADDERALL) 5 MG tablet    Sig: Take 5 mg by mouth daily.    Orders Placed This Encounter  Procedures  . Urine culture  . MM Digital Screening  . US Pelvis Complete  . US Transvaginal Non-OB  . CBC with Differential/Platelet  . COMPLETE METABOLIC PANEL WITH GFR  . Amylase  . Lipase  . T4, free  . TSH  . Uric acid  . Urinalysis w microscopic + reflex cultur

## 2017-04-06 NOTE — Assessment & Plan Note (Signed)
Using CPAP, followed by neurologist

## 2017-04-06 NOTE — Assessment & Plan Note (Signed)
Elevated uric acid, recheck today; suggested tart cherry juice

## 2017-04-06 NOTE — Patient Instructions (Addendum)
Do pick up some 1,000 iu vitamin D3 and take one a day Let's get labs and the ultrasound Return in 2-3 weeks for recheck

## 2017-04-07 LAB — URINALYSIS W MICROSCOPIC + REFLEX CULTURE
BACTERIA UA: NONE SEEN [HPF]
Bilirubin Urine: NEGATIVE
Casts: NONE SEEN [LPF]
Crystals: NONE SEEN [HPF]
GLUCOSE, UA: NEGATIVE
Hgb urine dipstick: NEGATIVE
Ketones, ur: NEGATIVE
LEUKOCYTES UA: NEGATIVE
NITRITE: NEGATIVE
PH: 5.5 (ref 5.0–8.0)
PROTEIN: NEGATIVE
Specific Gravity, Urine: 1.021 (ref 1.001–1.035)
WBC, UA: NONE SEEN WBC/HPF (ref <=5)

## 2017-04-07 LAB — AMYLASE: AMYLASE: 19 U/L — AB (ref 21–101)

## 2017-04-07 LAB — LIPASE: Lipase: 14 U/L (ref 7–60)

## 2017-04-07 LAB — URIC ACID: URIC ACID, SERUM: 9.4 mg/dL — AB (ref 2.5–7.0)

## 2017-04-07 LAB — TSH: TSH: 3.23 m[IU]/L

## 2017-04-07 LAB — T4, FREE: Free T4: 1.3 ng/dL (ref 0.8–1.8)

## 2017-04-08 LAB — URINE CULTURE

## 2017-04-12 ENCOUNTER — Other Ambulatory Visit: Payer: Self-pay | Admitting: Family Medicine

## 2017-04-12 ENCOUNTER — Ambulatory Visit: Admission: RE | Admit: 2017-04-12 | Payer: BLUE CROSS/BLUE SHIELD | Source: Ambulatory Visit

## 2017-04-12 ENCOUNTER — Other Ambulatory Visit: Payer: Self-pay

## 2017-04-12 DIAGNOSIS — R109 Unspecified abdominal pain: Secondary | ICD-10-CM

## 2017-04-12 DIAGNOSIS — R140 Abdominal distension (gaseous): Secondary | ICD-10-CM

## 2017-04-12 DIAGNOSIS — R634 Abnormal weight loss: Secondary | ICD-10-CM

## 2017-04-12 DIAGNOSIS — R110 Nausea: Secondary | ICD-10-CM

## 2017-04-12 MED ORDER — FLUCONAZOLE 150 MG PO TABS: 150.0000 mg | ORAL_TABLET | Freq: Once | ORAL | 0 refills | Status: AC

## 2017-04-12 NOTE — Progress Notes (Signed)
Diflucan sent to pharmacy.

## 2017-04-17 ENCOUNTER — Telehealth: Payer: Self-pay

## 2017-04-17 NOTE — Telephone Encounter (Signed)
Patient was informed that she has been scheduled to have her CT abdomen/pelvis on Thursday, April 20, 2017 [facility]   Patient was instructed to arrive about 15mins prior to her appt and that if she had any additional questions to give us a call.

## 2017-04-20 ENCOUNTER — Ambulatory Visit
Admission: RE | Admit: 2017-04-20 | Discharge: 2017-04-20 | Disposition: A | Discharge status: Home / Self Care | Payer: BLUE CROSS/BLUE SHIELD | Source: Ambulatory Visit | Attending: Family Medicine | Admitting: Family Medicine

## 2017-04-20 ENCOUNTER — Other Ambulatory Visit: Payer: Self-pay

## 2017-04-20 DIAGNOSIS — R161 Splenomegaly, not elsewhere classified: Secondary | ICD-10-CM | POA: Insufficient documentation

## 2017-04-20 DIAGNOSIS — K760 Fatty (change of) liver, not elsewhere classified: Secondary | ICD-10-CM | POA: Diagnosis not present

## 2017-04-20 DIAGNOSIS — R634 Abnormal weight loss: Secondary | ICD-10-CM | POA: Diagnosis present

## 2017-04-20 DIAGNOSIS — R110 Nausea: Secondary | ICD-10-CM | POA: Diagnosis present

## 2017-04-20 DIAGNOSIS — K8020 Calculus of gallbladder without cholecystitis without obstruction: Secondary | ICD-10-CM | POA: Insufficient documentation

## 2017-04-20 DIAGNOSIS — R140 Abdominal distension (gaseous): Secondary | ICD-10-CM | POA: Insufficient documentation

## 2017-04-20 DIAGNOSIS — R109 Unspecified abdominal pain: Secondary | ICD-10-CM | POA: Diagnosis not present

## 2017-04-20 MED ORDER — AMLODIPINE BESYLATE 10 MG PO TABS: 10.0000 mg | ORAL_TABLET | Freq: Every day | ORAL | 1 refills | Status: DC

## 2017-04-20 MED ORDER — IOPAMIDOL (ISOVUE-370) INJECTION 76%
100.0000 mL | Freq: Once | INTRAVENOUS | Status: AC | PRN
  Administered 2017-04-20: 100 mL via INTRAVENOUS

## 2017-05-02 ENCOUNTER — Encounter: Payer: Self-pay | Admitting: Family Medicine

## 2017-05-02 ENCOUNTER — Ambulatory Visit (INDEPENDENT_AMBULATORY_CARE_PROVIDER_SITE_OTHER): Payer: BLUE CROSS/BLUE SHIELD | Admitting: Family Medicine

## 2017-05-02 VITALS — BP 136/72 | HR 75 | Temp 97.6°F | Resp 14 | Wt 242.2 lb

## 2017-05-02 DIAGNOSIS — I517 Cardiomegaly: Secondary | ICD-10-CM | POA: Diagnosis not present

## 2017-05-02 DIAGNOSIS — R634 Abnormal weight loss: Secondary | ICD-10-CM | POA: Diagnosis not present

## 2017-05-02 DIAGNOSIS — M1A09X0 Idiopathic chronic gout, multiple sites, without tophus (tophi): Secondary | ICD-10-CM | POA: Diagnosis not present

## 2017-05-02 DIAGNOSIS — I10 Essential (primary) hypertension: Secondary | ICD-10-CM

## 2017-05-02 DIAGNOSIS — N289 Disorder of kidney and ureter, unspecified: Secondary | ICD-10-CM | POA: Insufficient documentation

## 2017-05-02 MED ORDER — COLCHICINE 0.6 MG PO TABS: ORAL_TABLET | ORAL | 2 refills | Status: DC

## 2017-05-02 MED ORDER — INDOMETHACIN 50 MG PO CAPS: 50.0000 mg | ORAL_CAPSULE | Freq: Three times a day (TID) | ORAL | 2 refills | Status: DC

## 2017-05-02 NOTE — Assessment & Plan Note (Signed)
Right kidney; 1.2 cm, mostly likely a cyst per radiologist report; reviewed with patient; no RBCs on last urine; would have expected lesion to be much larger if indeed cancer and causing weight loss; regardless, will bring her back in one month for send-out urine micro with dip and reflex culture if needed

## 2017-05-02 NOTE — Assessment & Plan Note (Signed)
Noted on CT scan; discussed with patient; recommended weight loss

## 2017-05-02 NOTE — Assessment & Plan Note (Signed)
Patient is trying to lose weight, eating less, cut out her soft drinks; she wants to lose 30 more pounds

## 2017-05-02 NOTE — Assessment & Plan Note (Addendum)
Rx for colcrys to use PRN; indomethacin PRN for gout flares; avoid trigger foods

## 2017-05-02 NOTE — Assessment & Plan Note (Signed)
Fair control.

## 2017-05-02 NOTE — Progress Notes (Signed)
BP 136/72   Pulse 75   Temp 97.6 F (36.4 C) (Oral)   Resp 14   Wt 242 lb 3.2 oz (109.9 kg)   SpO2 93%   BMI 41.57 kg/m    Subjective:    Patient ID: Amber Weiss, female    DOB: 19-Aug-1961, 56 y.o.   MRN: [id number]  HPI: Amber Weiss is a 55 y.o. female  Chief Complaint  Patient presents with  . 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 folding clothes, just age-related; no weird moles, no blood in urine She'll go have her moles checked out; father had melanoma; she takes tent to the beach and stays under that Nonsmoker; used to be a "long time ago", maybe a pack a day for 6-7 years Has always been hot natured, some night sweats; has had some flashes, hot flash thing like you hear women talk about; energy level is better lately; thinks the adderall has helped No double vision, no severe headaches; had a headache and thought her BP was up one day; that was at the beach, could have been dehydrated We talked about her CT scan; thought she might have irritable bowel; knows to stay near restroom, many restaurants; when she goes out to eat, loose stools but not fatty She had a flare of gout at the beach and ended up at the ER this last time; red meat was the trigger, and shrimp; Friday night had flare in left foot, couldn't even walk; she could have [facility] and gotten refills of the medicine that has helped her in the past, but she didn't have any and had to go tot he ER  Depression screen [facility] 2/9 04/06/2017 05/23/2016  Decreased Interest 0 0  Down, Depressed, Hopeless 0 0  PHQ - 2 Score 0 0   Relevant past  medical, surgical, family and social history reviewed Past Medical History:  Diagnosis Date  . Asthma   . Gout   . Hypertension   . OSA (obstructive sleep apnea)    Past Surgical History:  Procedure Laterality Date  . CESAREAN SECTION    . COLONOSCOPY  06/09/11   Family History  Problem Relation Age of Onset  . Cancer Mother        breast  . Hypertension Mother   . Emphysema Mother   . Cancer Father        skin  . Diabetes Father   . Cancer Sister        bladder  . Heart disease Neg Hx   . Stroke Neg Hx    Social History   Social History  . Marital status: Married    Spouse name: N/A  . Number of children: N/A  . Years of education: N/A   Occupational History  . Not on file.   Social History Main Topics  . Smoking status: Former Smoker    Packs/day: 1.00    Years: 12.00    Types: Cigarettes    Quit date: 09/29/1992  . Smokeless tobacco: Never Used  .  Alcohol use No  . Drug use: No  . Sexual activity: Not on file   Other Topics Concern  . Not on file   Social History Narrative  . No narrative on file   Interim medical history since last visit reviewed. Allergies and medications reviewed  Review of Systems Per HPI unless specifically indicated above     Objective:    BP 136/72   Pulse 75   Temp 97.6 F (36.4 C) (Oral)   Resp 14   Wt 242 lb 3.2 oz (109.9 kg)   SpO2 93%   BMI 41.57 kg/m   Wt Readings from Last 3 Encounters:  05/02/17 242 lb 3.2 oz (109.9 kg)  04/06/17 245 lb 8 oz (111.4 kg)  06/10/16 273 lb (123.8 kg)    Physical Exam  Constitutional: She appears well-developed and well-nourished. No distress.  Morbidly obese  HENT:  Head: Normocephalic and atraumatic.  Eyes: EOM are normal. No scleral icterus.  Neck: No thyromegaly present.  Cardiovascular: Normal rate, regular rhythm and normal heart sounds.   Pulmonary/Chest: Effort normal and breath sounds normal. No respiratory distress.  Abdominal: Soft. She exhibits no  distension.  Musculoskeletal: She exhibits no edema.  Neurological: She is alert.  Skin: Skin is warm and dry. She is not diaphoretic. No pallor.  Psychiatric: She has a normal mood and affect. Her behavior is normal. Judgment and thought content normal.    Results for orders placed or performed in visit on 04/06/17  Urine culture  Result Value Ref Range   Organism ID, Bacteria      Three or more organisms present,each greater than 10,000 CFU/mL.These organisms,commonly found on external and internal genitalia,are considered to be colonizers.No further testing performed.   CBC with Differential/Platelet  Result Value Ref Range   WBC 7.0 3.8 - 10.8 K/uL   RBC 4.41 3.80 - 5.10 MIL/uL   Hemoglobin 12.7 11.7 - 15.5 g/dL   HCT 39.0 35.0 - 45.0 %   MCV 88.4 80.0 - 100.0 fL   MCH 28.8 27.0 - 33.0 pg   MCHC 32.6 32.0 - 36.0 g/dL   RDW 15.8 (H) 11.0 - 15.0 %   Platelets 211 140 - 400 K/uL   MPV 10.7 7.5 - 12.5 fL   Neutro Abs 4,340 1,500 - 7,800 cells/uL   Lymphs Abs 1,820 850 - 3,900 cells/uL   Monocytes Absolute 350 200 - 950 cells/uL   Eosinophils Absolute 420 15 - 500 cells/uL   Basophils Absolute 70 0 - 200 cells/uL   Neutrophils Relative % 62 %   Lymphocytes Relative 26 %   Monocytes Relative 5 %   Eosinophils Relative 6 %   Basophils Relative 1 %   Smear Review Criteria for review not met   COMPLETE METABOLIC PANEL WITH GFR  Result Value Ref Range   Sodium 141 135 - 146 mmol/L   Potassium 4.0 3.5 - 5.3 mmol/L   Chloride 104 98 - 110 mmol/L   CO2 26 20 - 31 mmol/L   Glucose, Bld 89 65 - 99 mg/dL   BUN 22 7 - 25 mg/dL   Creat 1.01 0.50 - 1.05 mg/dL   Total Bilirubin 0.8 0.2 - 1.2 mg/dL   Alkaline Phosphatase 78 33 - 130 U/L   AST 25 10 - 35 U/L   ALT 26 6 - 29 U/L   Total Protein 7.8 6.1 - 8.1 g/dL   Albumin 4.6 3.6 - 5.1 g/dL   Calcium 9.5 8.6 - 10.4 mg/dL  GFR, Est African American 72 >=60 mL/min   GFR, Est Non African American 63 >=60 mL/min  Amylase  Result  Value Ref Range   Amylase 19 (L) 21 - 101 U/L  Lipase  Result Value Ref Range   Lipase 14 7 - 60 U/L  T4, free  Result Value Ref Range   Free T4 1.3 0.8 - 1.8 ng/dL  TSH  Result Value Ref Range   TSH 3.23 mIU/L  Uric acid  Result Value Ref Range   Uric Acid, Serum 9.4 (H) 2.5 - 7.0 mg/dL  Urinalysis w microscopic + reflex cultur  Result Value Ref Range   Color, Urine YELLOW YELLOW   APPearance CLEAR CLEAR   Specific Gravity, Urine 1.021 1.001 - 1.035   pH 5.5 5.0 - 8.0   Glucose, UA NEGATIVE NEGATIVE   Bilirubin Urine NEGATIVE NEGATIVE   Ketones, ur NEGATIVE NEGATIVE   Hgb urine dipstick NEGATIVE NEGATIVE   Protein, ur NEGATIVE NEGATIVE   Nitrite NEGATIVE NEGATIVE   Leukocytes, UA NEGATIVE NEGATIVE   WBC, UA NONE SEEN <=5 WBC/HPF   RBC / HPF 0-2 <=2 RBC/HPF   Squamous Epithelial / LPF 6-10 (A) <=5 HPF   Bacteria, UA NONE SEEN NONE SEEN HPF   Crystals NONE SEEN NONE SEEN HPF   Casts NONE SEEN NONE SEEN LPF   Yeast FEW (A) NONE SEEN HPF      Assessment & Plan:   Problem List Items Addressed This Visit      Cardiovascular and Mediastinum   Hypertension (Chronic)    Fair control      Cardiomegaly    Noted on CT scan; discussed with patient; recommended weight loss        Other   Morbid obesity (HCC) (Chronic)    Patient is trying to lose weight, eating less, cut out her soft drinks; she wants to lose 30 more pounds      Kidney lesion    Right kidney; 1.2 cm, mostly likely a cyst per radiologist report; reviewed with patient; no RBCs on last urine; would have expected lesion to be much larger if indeed cancer and causing weight loss; regardless, will bring her back in one month for send-out urine micro with dip and reflex culture if needed      Chronic gout    Rx for colcrys to use PRN; indomethacin PRN for gout flares; avoid trigger foods       Other Visit Diagnoses    Weight loss    -  Primary   patient actively trying to lose weight; patient to notify  me if anything she thinks may represent cancer; will work up if needed       Follow up plan: Return in about 1 month (around 06/01/2017) for weight and urine with CMA.  An after-visit summary was printed and given to the patient at [facility].  Please see the patient instructions which may contain other information and recommendations beyond what is mentioned above in the assessment and plan.  Meds ordered this encounter  Medications  . cholecalciferol (VITAMIN D) 1000 units tablet    Sig: Take 1,000 Units by mouth daily.  . Misc Natural Products (TART CHERRY ADVANCED PO)    Sig: Take by mouth.  . vitamin B-12 (CYANOCOBALAMIN) 100 MCG tablet    Sig: Take 100 mcg by mouth daily.  . indomethacin (INDOCIN) 50 MG capsule    Sig: Take 1 capsule (50 mg total) by mouth 3 (three) times daily with meals.  Dispense:  21 capsule    Refill:  2    Put on file and patient will call when needed  . colchicine (COLCRYS) 0.6 MG tablet    Sig: Take two pills by mouth at start of gout flare, then one more one hour later    Dispense:  6 tablet    Refill:  2    Put on file and patient will call when needed    No orders of the defined types were placed in this encounter.

## 2017-05-02 NOTE — Patient Instructions (Addendum)
Return for just a urine and weight check in one month Keep me posted on any new symptoms or findings Do see your dermatologist soon to have a complete mole check Healthy eating encouraged You have [facility] the [facility]rys [facility] needed for your next gout flares

## 2017-05-19 ENCOUNTER — Telehealth: Payer: Self-pay

## 2017-05-19 NOTE — Telephone Encounter (Signed)
Patient was informed that she has been scheduled for an US on 05/23/17 at [facility]. Patient was instructed to arrive at 3:45pm and to start drinking 32oz of water but be finished by 3:30pm without going to the restroom.  Patient expressed verbal understanding and said thanks.

## 2017-05-23 ENCOUNTER — Ambulatory Visit: Payer: BLUE CROSS/BLUE SHIELD

## 2017-10-12 ENCOUNTER — Telehealth: Payer: Self-pay | Admitting: Family Medicine

## 2017-10-12 NOTE — Telephone Encounter (Signed)
Patient notified, [facility] and given phone number.

## 2017-10-12 NOTE — Telephone Encounter (Signed)
Copied from CRM [phone]. Topic: General - Other >> Oct 12, 2017  9:32 AM Elliot GaultBell, Tiffany M wrote:  Relation to QI:ONGEpt:self Call back number: Pharmacy:  Reason for call:  Patient requesting mamo orders and would like to know specifically were orders were placed and contact information, please advise

## 2017-10-23 ENCOUNTER — Telehealth: Payer: Self-pay | Admitting: Family Medicine

## 2017-10-23 NOTE — Telephone Encounter (Signed)
Patient is overdue for a visit please I had hoped to see her in July I'll refill her BP medicine Thank you

## 2017-10-24 NOTE — Telephone Encounter (Signed)
Tried calling pt and mailbox is full.

## 2018-01-20 ENCOUNTER — Other Ambulatory Visit: Payer: Self-pay | Admitting: Family Medicine

## 2018-04-15 ENCOUNTER — Other Ambulatory Visit: Payer: Self-pay | Admitting: Family Medicine

## 2018-04-16 NOTE — Telephone Encounter (Signed)
Please schedule patient for an appointment please I'm closing out this note since it's been open for so long See other more recent phone note

## 2018-04-16 NOTE — Telephone Encounter (Signed)
Please see note from December Patient needs an appointment please I'll send 7 days in to allow her time to get on the schedule

## 2018-04-17 NOTE — Telephone Encounter (Signed)
Tried calling pt, no answer, no option for VM

## 2018-04-17 NOTE — Telephone Encounter (Signed)
Tried calling pt. No answer, no option for VM °

## 2018-05-21 ENCOUNTER — Other Ambulatory Visit: Payer: Self-pay | Admitting: Family Medicine

## 2018-05-22 NOTE — Telephone Encounter (Signed)
Pt.notified

## 2018-05-22 NOTE — Telephone Encounter (Signed)
Patient has not been seen in over a year Will need an appt If acute issue, urgent care or e-visit

## 2018-05-31 ENCOUNTER — Encounter: Payer: Self-pay | Admitting: Family Medicine

## 2018-05-31 ENCOUNTER — Ambulatory Visit: Payer: BLUE CROSS/BLUE SHIELD | Admitting: Family Medicine

## 2018-05-31 VITALS — BP 124/78 | HR 88 | Temp 98.5°F | Resp 14 | Ht 64.0 in | Wt 252.0 lb

## 2018-05-31 DIAGNOSIS — N289 Disorder of kidney and ureter, unspecified: Secondary | ICD-10-CM | POA: Diagnosis not present

## 2018-05-31 DIAGNOSIS — E786 Lipoprotein deficiency: Secondary | ICD-10-CM

## 2018-05-31 DIAGNOSIS — Z1231 Encounter for screening mammogram for malignant neoplasm of breast: Secondary | ICD-10-CM | POA: Diagnosis not present

## 2018-05-31 DIAGNOSIS — Z0000 Encounter for general adult medical examination without abnormal findings: Secondary | ICD-10-CM | POA: Diagnosis not present

## 2018-05-31 DIAGNOSIS — K760 Fatty (change of) liver, not elsewhere classified: Secondary | ICD-10-CM | POA: Diagnosis not present

## 2018-05-31 DIAGNOSIS — M1A09X0 Idiopathic chronic gout, multiple sites, without tophus (tophi): Secondary | ICD-10-CM | POA: Diagnosis not present

## 2018-05-31 DIAGNOSIS — E559 Vitamin D deficiency, unspecified: Secondary | ICD-10-CM | POA: Diagnosis not present

## 2018-05-31 DIAGNOSIS — I10 Essential (primary) hypertension: Secondary | ICD-10-CM | POA: Diagnosis not present

## 2018-05-31 DIAGNOSIS — R161 Splenomegaly, not elsewhere classified: Secondary | ICD-10-CM | POA: Insufficient documentation

## 2018-05-31 DIAGNOSIS — E538 Deficiency of other specified B group vitamins: Secondary | ICD-10-CM | POA: Diagnosis not present

## 2018-05-31 DIAGNOSIS — K8020 Calculus of gallbladder without cholecystitis without obstruction: Secondary | ICD-10-CM

## 2018-05-31 DIAGNOSIS — Z5181 Encounter for therapeutic drug level monitoring: Secondary | ICD-10-CM

## 2018-05-31 DIAGNOSIS — Z1239 Encounter for other screening for malignant neoplasm of breast: Secondary | ICD-10-CM

## 2018-05-31 NOTE — Patient Instructions (Addendum)
Check out the information at [email or web address].org entitled "Nutrition for Weight Loss: What You Need to Know about Fad Diets" Try to lose between 1-2 pounds per week by taking in fewer calories and burning off more calories You can succeed by limiting portions, limiting foods dense in calories and fat, becoming more active, and drinking 8 glasses of water a day (64 ounces) Don't skip meals, especially breakfast, as skipping meals may alter your metabolism Do not use over-the-counter weight loss pills or gimmicks that claim rapid weight loss A healthy BMI (or body mass index) is between 18.5 and 24.9 You can calculate your ideal BMI at the NIH website JobEconomics.hu  Please call [phone] to schedule your imaging test Please wait 2-3 days after the order has been placed to call and get your test scheduled   Obesity, Adult Obesity is the condition of having too much total body fat. Being overweight or obese means that your weight is greater than what is considered healthy for your body size. Obesity is determined by a measurement called BMI. BMI is an estimate of body fat and is calculated from height and weight. For adults, a BMI of 30 or higher is considered obese. Obesity can eventually lead to other health concerns and major illnesses, including:  Stroke.  Coronary artery disease (CAD).  Type 2 diabetes.  Some types of cancer, including cancers of the colon, breast, uterus, and gallbladder.  Osteoarthritis.  High blood pressure (hypertension).  High cholesterol.  Sleep apnea.  Gallbladder stones.  Infertility problems.  What are the causes? The main cause of obesity is taking in (consuming) more calories than your body uses for energy. Other factors that contribute to this condition may include:  Being born with genes that make you more likely to become obese.  Having a medical condition that causes obesity. These  conditions include: ? Hypothyroidism. ? Polycystic ovarian syndrome (PCOS). ? Binge-eating disorder. ? Cushing syndrome.  Taking certain medicines, such as steroids, antidepressants, and seizure medicines.  Not being physically active (sedentary lifestyle).  Living where there are limited places to exercise safely or buy healthy foods.  Not getting enough sleep.  What increases the risk? The following factors may increase your risk of this condition:  Having a family history of obesity.  Being a woman of African-American descent.  Being a man of Hispanic descent.  What are the signs or symptoms? Having excessive body fat is the main symptom of this condition. How is this diagnosed? This condition may be diagnosed based on:  Your symptoms.  Your medical history.  A physical exam. Your health care provider may measure: ? Your BMI. If you are an adult with a BMI between 25 and less than 30, you are considered overweight. If you are an adult with a BMI of 30 or higher, you are considered obese. ? The distances around your hips and your waist (circumferences). These may be compared to each other to help diagnose your condition. ? Your skinfold thickness. Your health care provider may gently pinch a fold of your skin and measure it.  How is this treated? Treatment for this condition often includes changing your lifestyle. Treatment may include some or all of the following:  Dietary changes. Work with your health care provider and a dietitian to set a weight-loss goal that is healthy and reasonable for you. Dietary changes may include eating: ? Smaller portions. A portion size is the amount of a particular food that is healthy for you to eat  at one time. This varies from person to person. ? Low-calorie or low-fat options. ? More whole grains, fruits, and vegetables.  Regular physical activity. This may include aerobic activity (cardio) and strength training.  Medicine to help  you lose weight. Your health care provider may prescribe medicine if you are unable to lose 1 pound a week after 6 weeks of eating more healthily and doing more physical activity.  Surgery. Surgical options may include gastric banding and gastric bypass. Surgery may be done if: ? Other treatments have not helped to improve your condition. ? You have a BMI of 40 or higher. ? You have life-threatening health problems related to obesity.  Follow these instructions at home:  Eating and drinking   Follow recommendations from your health care provider about what you eat and drink. Your health care provider may advise you to: ? Limit fast foods, sweets, and processed snack foods. ? Choose low-fat options, such as low-fat milk instead of whole milk. ? Eat 5 or more servings of fruits or vegetables every day. ? Eat at home more often. This gives you more control over what you eat. ? Choose healthy foods when you eat out. ? Learn what a healthy portion size is. ? Keep low-fat snacks on hand. ? Avoid sugary drinks, such as soda, fruit juice, iced tea sweetened with sugar, and flavored milk. ? Eat a healthy breakfast.  Drink enough water to keep your urine clear or pale yellow.  Do not go without eating for long periods of time (do not fast) or follow a fad diet. Fasting and fad diets can be unhealthy and even dangerous. Physical Activity  Exercise regularly, as told by your health care provider. Ask your health care provider what types of exercise are safe for you and how often you should exercise.  Warm up and stretch before being active.  Cool down and stretch after being active.  Rest between periods of activity. Lifestyle  Limit the time that you spend in front of your TV, computer, or video game system.  Find ways to reward yourself that do not involve food.  Limit alcohol intake to no more than 1 drink a day for nonpregnant women and 2 drinks a day for men. One drink equals 12 oz  of beer, 5 oz of wine, or 1 oz of hard liquor. General instructions  Keep a weight loss journal to keep track of the food you eat and how much you exercise you get.  Take over-the-counter and prescription medicines only as told by your health care provider.  Take vitamins and supplements only as told by your health care provider.  Consider joining a support group. Your health care provider may be able to recommend a support group.  Keep all follow-up visits as told by your health care provider. This is important. Contact a health care provider if:  You are unable to meet your weight loss goal after 6 weeks of dietary and lifestyle changes. This information is not intended to replace advice given to you by your health care provider. Make sure you discuss any questions you have with your health care provider. Document Released: 12/08/2004 Document Revised: 04/04/2016 Document Reviewed: 08/19/2015 Elsevier Interactive Patient Education  2018 Elsevier Inc.  Preventing Unhealthy [employer], Adult Staying at a healthy weight is important. When fat builds up in your body, you may become overweight or obese. These conditions put you at greater risk for developing certain health problems, such as heart disease, diabetes, sleeping  problems, joint problems, and some cancers. Unhealthy weight gain is often the result of making unhealthy choices in what you eat. It is also a result of not getting enough exercise. You can make changes to your lifestyle to prevent obesity and stay as healthy as possible. What nutrition changes can be made? To maintain a healthy weight and prevent obesity:  Eat only as much as your body needs. To do this: ? Pay attention to signs that you are hungry or full. Stop eating as soon as you feel full. ? If you feel hungry, try drinking water first. Drink enough water so your urine is clear or pale yellow. ? Eat smaller portions. ? Look at serving sizes on food labels. Most  foods contain more than one serving per container. ? Eat the recommended amount of calories for your gender and activity level. While most active people should eat around 2,000 calories per day, if you are trying to lose weight or are not very active, you main need to eat less calories. Talk to your health care provider or dietitian about how many calories you should eat each day.  Choose healthy foods, such as: ? Fruits and vegetables. Try to fill at least half of your plate at each meal with fruits and vegetables. ? Whole grains, such as whole wheat bread, brown rice, and quinoa. ? Lean meats, such as chicken or fish. ? Other healthy proteins, such as beans, eggs, or tofu. ? Healthy fats, such as nuts, seeds, fatty fish, and olive oil. ? Low-fat or fat-free dairy.  Check food labels and avoid food and drinks that: ? Are high in calories. ? Have added sugar. ? Are high in sodium. ? Have saturated fats or trans fats.  Limit how much you eat of the following foods: ? Prepackaged meals. ? Fast food. ? Fried foods. ? Processed meat, such as bacon, sausage, and deli meats. ? Fatty cuts of red meat and poultry with skin.  Cook foods in healthier ways, such as by baking, broiling, or grilling.  When grocery shopping, try to shop around the outside of the store. This helps you buy mostly fresh foods and avoid canned and prepackaged foods.  What lifestyle changes can be made?  Exercise at least 30 minutes 5 or more days each week. Exercising includes brisk walking, yard work, biking, running, swimming, and team sports like basketball and soccer. Ask your health care provider which exercises are safe for you.  Do not use any products that contain nicotine or tobacco, such as cigarettes and e-cigarettes. If you need help quitting, ask your health care provider.  Limit alcohol intake to no more than 1 drink a day for nonpregnant women and 2 drinks a day for men. One drink equals 12 oz of beer,  5 oz of wine, or 1 oz of hard liquor.  Try to get 7-9 hours of sleep each night. What other changes can be made?  Keep a food and activity journal to keep track of: ? What you ate and how many calories you had. Remember to count sauces, dressings, and side dishes. ? Whether you were active, and what exercises you did. ? Your calorie, weight, and activity goals.  Check your weight regularly. Track any changes. If you notice you have gained weight, make changes to your diet or activity routine.  Avoid taking weight-loss medicines or supplements. Talk to your health care provider before starting any new medicine or supplement.  Talk to your health care provider  before trying any new diet or exercise plan. Why are these changes important? Eating healthy, staying active, and having healthy habits not only help prevent obesity, they also:  Help you to manage stress and emotions.  Help you to connect with friends and family.  Improve your self-esteem.  Improve your sleep.  Prevent long-term health problems.  What can happen if changes are not made? Being obese or overweight can cause you to develop joint or bone problems, which can make it hard for you to stay active or do activities you enjoy. Being obese or overweight also puts stress on your heart and lungs and can lead to health problems like diabetes, heart disease, and some cancers. Where to find more information: Talk with your health care provider or a dietitian about healthy eating and healthy lifestyle choices. You may also find other information through these resources:  U.S. Department of Agriculture MyPlate: https://ball-collins.biz/www.choosemyplate.gov  American Heart Association: www.heart.org  Centers for Disease Control and Prevention: FootballExhibition.com.brwww.cdc.gov  Summary  Staying at a healthy weight is important. It helps prevent certain diseases and health problems, such as heart disease, diabetes, joint problems, sleep disorders, and some  cancers.  Being obese or overweight can cause you to develop joint or bone problems, which can make it hard for you to stay active or do activities you enjoy.  You can prevent unhealthy weight gain by eating a healthy diet, exercising regularly, not smoking, limiting alcohol, and getting enough sleep.  Talk with your health care provider or a dietitian for guidance about healthy eating and healthy lifestyle choices. This information is not intended to replace advice given to you by your health care provider. Make sure you discuss any questions you have with your health care provider. Document Released: 11/01/2016 Document Revised: 12/07/2016 Document Reviewed: 12/07/2016 Elsevier Interactive Patient Education  2018 [employer]  DASH Eating Plan DASH stands for "Dietary Approaches to Stop Hypertension." The DASH eating plan is a healthy eating plan that has been shown to reduce high blood pressure (hypertension). It may also reduce your risk for type 2 diabetes, heart disease, and stroke. The DASH eating plan may also help with weight loss. What are tips for following this plan? General guidelines  Avoid eating more than 2,300 mg (milligrams) of salt (sodium) a day. If you have hypertension, you may need to reduce your sodium intake to 1,500 mg a day.  Limit alcohol intake to no more than 1 drink a day for nonpregnant women and 2 drinks a day for men. One drink equals 12 oz of beer, 5 oz of wine, or 1 oz of hard liquor.  Work with your health care provider to maintain a healthy body weight or to lose weight. Ask what an ideal weight is for you.  Get at least 30 minutes of exercise that causes your heart to beat faster (aerobic exercise) most days of the week. Activities may include walking, swimming, or biking.  Work with your health care provider or diet and nutrition specialist (dietitian) to adjust your eating plan to your individual calorie needs. Reading food labels  Check food  labels for the amount of sodium per serving. Choose foods with less than 5 percent of the Daily Value of sodium. Generally, foods with less than 300 mg of sodium per serving fit into this eating plan.  To find whole grains, look for the word "whole" as the first word in the ingredient list. Shopping  Buy products labeled as "low-sodium" or "no salt added."  Buy fresh foods. Avoid canned foods and premade or frozen meals. Cooking  Avoid adding salt when cooking. Use salt-free seasonings or herbs instead of table salt or sea salt. Check with your health care provider or pharmacist before using salt substitutes.  Do not fry foods. Cook foods using healthy methods such as baking, boiling, grilling, and broiling instead.  Cook with heart-healthy oils, such as olive, canola, soybean, or sunflower oil. Meal planning   Eat a balanced diet that includes: ? 5 or more servings of fruits and vegetables each day. At each meal, try to fill half of your plate with fruits and vegetables. ? Up to 6-8 servings of whole grains each day. ? Less than 6 oz of lean meat, poultry, or fish each day. A 3-oz serving of meat is about the same size as a deck of cards. One egg equals 1 oz. ? 2 servings of low-fat dairy each day. ? A serving of nuts, seeds, or beans 5 times each week. ? Heart-healthy fats. Healthy fats called Omega-3 fatty acids are found in foods such as flaxseeds and coldwater fish, like sardines, salmon, and mackerel.  Limit how much you eat of the following: ? Canned or prepackaged foods. ? Food that is high in trans fat, such as fried foods. ? Food that is high in saturated fat, such as fatty meat. ? Sweets, desserts, sugary drinks, and other foods with added sugar. ? Full-fat dairy products.  Do not salt foods before eating.  Try to eat at least 2 vegetarian meals each week.  Eat more home-cooked food and less restaurant, buffet, and fast food.  When eating at a restaurant, ask that  your food be prepared with less salt or no salt, if possible. What foods are recommended? The items listed may not be a complete list. Talk with your dietitian about what dietary choices are best for you. Grains Whole-grain or whole-wheat bread. Whole-grain or whole-wheat pasta. Brown rice. Orpah Cobb. Bulgur. Whole-grain and low-sodium cereals. Pita bread. Low-fat, low-sodium crackers. Whole-wheat flour tortillas. Vegetables Fresh or frozen vegetables (raw, steamed, roasted, or grilled). Low-sodium or reduced-sodium tomato and vegetable juice. Low-sodium or reduced-sodium tomato sauce and tomato paste. Low-sodium or reduced-sodium canned vegetables. Fruits All fresh, dried, or frozen fruit. Canned fruit in natural juice (without added sugar). Meat and other protein foods Skinless chicken or [place]. Ground chicken or [place]. Pork with fat trimmed off. Fish and seafood. Egg whites. Dried beans, peas, or lentils. Unsalted nuts, nut butters, and seeds. Unsalted canned beans. Lean cuts of beef with fat trimmed off. Low-sodium, lean deli meat. Dairy Low-fat (1%) or fat-free (skim) milk. Fat-free, low-fat, or reduced-fat cheeses. Nonfat, low-sodium ricotta or cottage cheese. Low-fat or nonfat yogurt. Low-fat, low-sodium cheese. Fats and oils Soft margarine without trans fats. Vegetable oil. Low-fat, reduced-fat, or light mayonnaise and salad dressings (reduced-sodium). Canola, safflower, olive, soybean, and sunflower oils. Avocado. Seasoning and other foods Herbs. Spices. Seasoning mixes without salt. Unsalted popcorn and pretzels. Fat-free sweets. What foods are not recommended? The items listed may not be a complete list. Talk with your dietitian about what dietary choices are best for you. Grains Baked goods made with fat, such as croissants, muffins, or some breads. Dry pasta or rice meal packs. Vegetables Creamed or fried vegetables. Vegetables in a cheese sauce. Regular canned vegetables  (not low-sodium or reduced-sodium). Regular canned tomato sauce and paste (not low-sodium or reduced-sodium). Regular tomato and vegetable juice (not low-sodium or reduced-sodium). Rosita Fire. Olives. Fruits Canned fruit in  a light or heavy syrup. Fried fruit. Fruit in cream or butter sauce. Meat and other protein foods Fatty cuts of meat. Ribs. Fried meat. Tomasa Blase. Sausage. Bologna and other processed lunch meats. Salami. Fatback. Hotdogs. Bratwurst. Salted nuts and seeds. Canned beans with added salt. Canned or smoked fish. Whole eggs or egg yolks. Chicken or [place] with skin. Dairy Whole or 2% milk, cream, and half-and-half. Whole or full-fat cream cheese. Whole-fat or sweetened yogurt. Full-fat cheese. Nondairy creamers. Whipped toppings. Processed cheese and cheese spreads. Fats and oils Butter. Stick margarine. Lard. Shortening. Ghee. Bacon fat. Tropical oils, such as coconut, palm kernel, or palm oil. Seasoning and other foods Salted popcorn and pretzels. Onion salt, garlic salt, seasoned salt, table salt, and sea salt. Worcestershire sauce. Tartar sauce. Barbecue sauce. Teriyaki sauce. Soy sauce, including reduced-sodium. Steak sauce. Canned and packaged gravies. Fish sauce. Oyster sauce. Cocktail sauce. Horseradish that you find on the shelf. Ketchup. Mustard. Meat flavorings and tenderizers. Bouillon cubes. Hot sauce and Tabasco sauce. Premade or packaged marinades. Premade or packaged taco seasonings. Relishes. Regular salad dressings. Where to find more information:  National Heart, Lung, and Blood Institute: PopSteam.is  American Heart Association: www.heart.org Summary  The DASH eating plan is a healthy eating plan that has been shown to reduce high blood pressure (hypertension). It may also reduce your risk for type 2 diabetes, heart disease, and stroke.  With the DASH eating plan, you should limit salt (sodium) intake to 2,300 mg a day. If you have hypertension, you may need to  reduce your sodium intake to 1,500 mg a day.  When on the DASH eating plan, aim to eat more fresh fruits and vegetables, whole grains, lean proteins, low-fat dairy, and heart-healthy fats.  Work with your health care provider or diet and nutrition specialist (dietitian) to adjust your eating plan to your individual calorie needs. This information is not intended to replace advice given to you by your health care provider. Make sure you discuss any questions you have with your health care provider. Document Released: 10/20/2011 Document Revised: 10/24/2016 Document Reviewed: 10/24/2016 Elsevier Interactive Patient Education  [employer].

## 2018-05-31 NOTE — Assessment & Plan Note (Signed)
Check lipids; encouraged weight loss and more activity

## 2018-05-31 NOTE — Assessment & Plan Note (Signed)
Well controlled 

## 2018-05-31 NOTE — Assessment & Plan Note (Signed)
Not taking vitamin B12

## 2018-05-31 NOTE — Assessment & Plan Note (Signed)
Check liver and kidneys 

## 2018-05-31 NOTE — Assessment & Plan Note (Signed)
Doing physical labs today with other labs and she'll return for a physical

## 2018-05-31 NOTE — Assessment & Plan Note (Signed)
Check level and replace if needed 

## 2018-05-31 NOTE — Assessment & Plan Note (Signed)
Check renal function today; check uric acid; try tart cherry, limiting red meat and alcohol and shrimp

## 2018-05-31 NOTE — Assessment & Plan Note (Signed)
Nonalcoholic; weight loss and low fat diet

## 2018-05-31 NOTE — Progress Notes (Signed)
BP 124/78   Pulse 88   Temp 98.5 F (36.9 C) (Oral)   Resp 14   Ht _0  (1.626 m)   Wt 252 lb (114.3 kg)   SpO2 94%   BMI 43.26 kg/m    Subjective:    Patient ID: Amber Weiss, female    DOB: [birth year], 57 y.o.   MRN: [id number]  HPI: Amber Weiss is a 57 y.o. female  Chief Complaint  Patient presents with  . 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 Lab Results  Component Value Date   CHOL 150 06/10/2016   HDL 38 (L) 06/10/2016   [place] 70 06/10/2016   TRIG 211 (H) 06/10/2016   CHOLHDL 3.9 06/10/2016   Morbid obesity; she does better with a plan; she does very well when she has the plan; fixing mac and cheese without that; drinks a lot of water; stopped a lot of the processed food; does eat salads  Small lesion on kidney; no hematuria  Some pain in the stomach across the front; not enough to be serious; last CT scan showed fatty liver, gallstones, mild splenectomy; she declined scan today; not significant right now  Depression screen [facility] 2/9 05/31/2018 04/06/2017 05/23/2016  Decreased Interest 0 0 0  Down, Depressed, Hopeless 0 0 0  PHQ - 2 Score 0 0 0    Relevant past medical, surgical, family and social history reviewed Past Medical History:  Diagnosis Date  . Asthma   . Gout   . Hypertension   . OSA (obstructive sleep apnea)    Past Surgical History:  Procedure Laterality Date  . CESAREAN SECTION    .  COLONOSCOPY  06/09/11   Family History  Problem Relation Age of Onset  . Cancer Mother        breast  . Hypertension Mother   . Emphysema Mother   . Cancer Father        skin  . Diabetes Father   . Cancer Sister        bladder  . Heart disease Neg Hx   . Stroke Neg Hx    Social History   Tobacco Use  . Smoking status: Former Smoker    Packs/day: 1.00    Years: 12.00    Pack years: 12.00    Types: Cigarettes    Last attempt to quit: 09/29/1992    Years since quitting: 25.6  . Smokeless tobacco: Never Used  Substance Use Topics  . Alcohol use: No  . Drug use: No    Interim medical history since last visit reviewed. Allergies and medications reviewed  Review of Systems Per HPI unless specifically indicated above     Objective:    BP 124/78   Pulse 88   Temp 98.5 F (36.9 C) (Oral)   Resp 14   Ht _1  (1.626 m)  Wt 252 lb (114.3 kg)   SpO2 94%   BMI 43.26 kg/m   Wt Readings from Last 3 Encounters:  05/31/18 252 lb (114.3 kg)  05/02/17 242 lb 3.2 oz (109.9 kg)  04/06/17 245 lb 8 oz (111.4 kg)    Physical Exam  Constitutional: She appears well-developed and well-nourished. No distress.  HENT:  Head: Normocephalic and atraumatic.  Eyes: EOM are normal. No scleral icterus.  Neck: No thyromegaly present.  Cardiovascular: Normal rate, regular rhythm and normal heart sounds.  No murmur heard. Pulmonary/Chest: Effort normal and breath sounds normal. No respiratory distress. She has no wheezes.  Abdominal: Soft. Bowel sounds are normal. She exhibits no distension and no mass (body habitus makes palpation difficult). There is no tenderness. There is no guarding.  Musculoskeletal: Normal range of motion. She exhibits no edema.  No erythema or swelling at site of previous gout flare, 5th MTP LEFT foot; no swelling or erythema of the 1st MTPs either foot  Neurological: She is alert. She exhibits normal muscle tone.  Skin: Skin is warm and dry. She is not  diaphoretic. No pallor.  Psychiatric: She has a normal mood and affect. Her behavior is normal. Judgment and thought content normal. Her mood appears not anxious. She does not exhibit a depressed mood.    Results for orders placed or performed in visit on 04/06/17  Urine culture  Result Value Ref Range   Organism ID, Bacteria      Three or more organisms present,each greater than 10,000 CFU/mL.These organisms,commonly found on external and internal genitalia,are considered to be colonizers.No further testing performed.   CBC with Differential/Platelet  Result Value Ref Range   WBC 7.0 3.8 - 10.8 K/uL   RBC 4.41 3.80 - 5.10 MIL/uL   Hemoglobin 12.7 11.7 - 15.5 g/dL   HCT 39.0 35.0 - 45.0 %   MCV 88.4 80.0 - 100.0 fL   MCH 28.8 27.0 - 33.0 pg   MCHC 32.6 32.0 - 36.0 g/dL   RDW 15.8 (H) 11.0 - 15.0 %   Platelets 211 140 - 400 K/uL   MPV 10.7 7.5 - 12.5 fL   Neutro Abs 4,340 1,500 - 7,800 cells/uL   Lymphs Abs 1,820 850 - 3,900 cells/uL   Monocytes Absolute 350 200 - 950 cells/uL   Eosinophils Absolute 420 15 - 500 cells/uL   Basophils Absolute 70 0 - 200 cells/uL   Neutrophils Relative % 62 %   Lymphocytes Relative 26 %   Monocytes Relative 5 %   Eosinophils Relative 6 %   Basophils Relative 1 %   Smear Review Criteria for review not met   COMPLETE METABOLIC PANEL WITH GFR  Result Value Ref Range   Sodium 141 135 - 146 mmol/L   Potassium 4.0 3.5 - 5.3 mmol/L   Chloride 104 98 - 110 mmol/L   CO2 26 20 - 31 mmol/L   Glucose, Bld 89 65 - 99 mg/dL   BUN 22 7 - 25 mg/dL   Creat 1.01 0.50 - 1.05 mg/dL   Total Bilirubin 0.8 0.2 - 1.2 mg/dL   Alkaline Phosphatase 78 33 - 130 U/L   AST 25 10 - 35 U/L   ALT 26 6 - 29 U/L   Total Protein 7.8 6.1 - 8.1 g/dL   Albumin 4.6 3.6 - 5.1 g/dL   Calcium 9.5 8.6 - 10.4 mg/dL   GFR, Est African American 72 >=60 mL/min   GFR, Est Non African American 63 >=60 mL/min  Amylase  Result Value Ref Range   Amylase 19 (L) 21 - 101 U/L  Lipase    Result Value Ref Range   Lipase 14 7 - 60 U/L  T4, free  Result Value Ref Range   Free T4 1.3 0.8 - 1.8 ng/dL  TSH  Result Value Ref Range   TSH 3.23 mIU/L  Uric acid  Result Value Ref Range   Uric Acid, Serum 9.4 (H) 2.5 - 7.0 mg/dL  Urinalysis w microscopic + reflex cultur  Result Value Ref Range   Color, Urine YELLOW YELLOW   APPearance CLEAR CLEAR   Specific Gravity, Urine 1.021 1.001 - 1.035   pH 5.5 5.0 - 8.0   Glucose, UA NEGATIVE NEGATIVE   Bilirubin Urine NEGATIVE NEGATIVE   Ketones, ur NEGATIVE NEGATIVE   Hgb urine dipstick NEGATIVE NEGATIVE   Protein, ur NEGATIVE NEGATIVE   Nitrite NEGATIVE NEGATIVE   Leukocytes, UA NEGATIVE NEGATIVE   WBC, UA NONE SEEN <=5 WBC/HPF   RBC / HPF 0-2 <=2 RBC/HPF   Squamous Epithelial / LPF 6-10 (A) <=5 HPF   Bacteria, UA NONE SEEN NONE SEEN HPF   Crystals NONE SEEN NONE SEEN HPF   Casts NONE SEEN NONE SEEN LPF   Yeast FEW (A) NONE SEEN HPF      Assessment & Plan:   Problem List Items Addressed This Visit      Cardiovascular and Mediastinum   Hypertension (Chronic)    Well-controlled        Digestive   Fatty liver    Nonalcoholic; weight loss and low fat diet      Relevant Orders   US Abdomen Complete     Other   Morbid obesity (HCC) (Chronic)   Relevant Orders   Amb ref to Medical Nutrition Therapy-MNT   Kidney lesion   Relevant Orders   Urinalysis w microscopic + reflex cultur   US Abdomen Complete   Vitamin D deficiency    Check level and replace if needed      Relevant Orders   VITAMIN D 25 Hydroxy (Vit-D Deficiency, Fractures)   Vitamin B12 deficiency    Not taking vitamin B12      Relevant Orders   Vitamin B12   Splenomegaly    [place] order      Relevant Orders   US Abdomen Complete   Preventative health care    Doing physical labs today with other labs and she'll return for a physical      Relevant Orders   TSH   Lipid panel   CBC with Differential/Platelet   COMPLETE METABOLIC PANEL  WITH GFR   Low HDL (under 40)    Check lipids; encouraged weight loss and more activity      Encounter for medication monitoring    Check liver and kidneys      Chronic gout - Primary    Check renal function today; check uric acid; try tart cherry, limiting red meat and alcohol and shrimp      Relevant Orders   Uric acid    Other Visit Diagnoses    Screening for breast cancer       Relevant Orders   MM DIGITAL SCREENING BILATERAL   Calculus of gallbladder without cholecystitis without obstruction       Relevant Orders   US Abdomen Complete       Follow up plan: Return in about 1 month (around 06/28/2018) for CPE.  An after-visit summary was printed and given to  the patient at [place].  Please see the patient instructions which may contain other information and recommendations beyond what is mentioned above in the assessment and plan.  No orders of the defined types were placed in this encounter.  Orders Placed This Encounter  Procedures  . US Abdomen Complete  . MM DIGITAL SCREENING BILATERAL  . VITAMIN D 25 Hydroxy (Vit-D Deficiency, Fractures)  . TSH  . Lipid panel  . CBC with Differential/Platelet  . COMPLETE METABOLIC PANEL WITH GFR  . Vitamin B12  . Uric acid  . Urinalysis w microscopic + reflex cultur  . Amb ref to Medical Nutrition Therapy-MNT

## 2018-05-31 NOTE — Assessment & Plan Note (Signed)
US order

## 2018-06-05 ENCOUNTER — Ambulatory Visit: Payer: BLUE CROSS/BLUE SHIELD | Admitting: Family Medicine

## 2018-06-06 ENCOUNTER — Other Ambulatory Visit: Payer: Self-pay | Admitting: Family Medicine

## 2018-06-07 ENCOUNTER — Other Ambulatory Visit: Payer: Self-pay | Admitting: Family Medicine

## 2018-06-07 MED ORDER — AMLODIPINE BESYLATE 10 MG PO TABS: 10.0000 mg | ORAL_TABLET | Freq: Every day | ORAL | 11 refills | Status: DC

## 2018-06-07 NOTE — Telephone Encounter (Signed)
Copied from CRM [id number]. Topic: Quick Communication - Rx Refill/Question >> Jun 07, 2018  9:19 AM Gaynelle AduPoole, Shalonda wrote: Medication: amLODipine (NORVASC) 10 MG tablet   Has the patient contacted their pharmacy? No   Preferred Pharmacy (with phone number or street name): [id number] - [place], [place] - [address] [phone] (Phone) [phone] (Fax)

## 2018-06-12 ENCOUNTER — Ambulatory Visit: Payer: BLUE CROSS/BLUE SHIELD

## 2018-06-14 LAB — URINALYSIS W MICROSCOPIC + REFLEX CULTURE
Bacteria, UA: NONE SEEN /HPF
Bilirubin Urine: NEGATIVE
GLUCOSE, UA: NEGATIVE
Hgb urine dipstick: NEGATIVE
Hyaline Cast: NONE SEEN /LPF
Ketones, ur: NEGATIVE
Nitrites, Initial: NEGATIVE
PROTEIN: NEGATIVE
RBC / HPF: NONE SEEN /HPF (ref 0–2)
Specific Gravity, Urine: 1.017 (ref 1.001–1.03)
pH: <=5 (ref 5.0–8.0)

## 2018-06-14 LAB — COMPLETE METABOLIC PANEL WITH GFR
AG Ratio: 1.6 (calc) (ref 1.0–2.5)
ALT: 20 U/L (ref 6–29)
AST: 18 U/L (ref 10–35)
Albumin: 4.5 g/dL (ref 3.6–5.1)
Alkaline phosphatase (APISO): 79 U/L (ref 33–130)
BUN: 14 mg/dL (ref 7–25)
CO2: 25 mmol/L (ref 20–32)
Calcium: 9 mg/dL (ref 8.6–10.4)
Chloride: 107 mmol/L (ref 98–110)
Creat: 0.92 mg/dL (ref 0.50–1.05)
GFR, EST AFRICAN AMERICAN: 80 mL/min/{1.73_m2} (ref >=60)
GFR, EST NON AFRICAN AMERICAN: 69 mL/min/{1.73_m2} (ref >=60)
Globulin: 2.8 g/dL (calc) (ref 1.9–3.7)
Glucose, Bld: 89 mg/dL (ref 65–99)
Potassium: 4.1 mmol/L (ref 3.5–5.3)
SODIUM: 142 mmol/L (ref 135–146)
TOTAL PROTEIN: 7.3 g/dL (ref 6.1–8.1)
Total Bilirubin: 0.6 mg/dL (ref 0.2–1.2)

## 2018-06-14 LAB — URINE CULTURE
MICRO NUMBER:: 90904811
SPECIMEN QUALITY:: ADEQUATE

## 2018-06-14 LAB — CBC WITH DIFFERENTIAL/PLATELET
Basophils Absolute: 51 cells/uL (ref 0–200)
Basophils Relative: 1 %
EOS PCT: 8.3 %
Eosinophils Absolute: 423 cells/uL (ref 15–500)
HEMATOCRIT: 34.8 % — AB (ref 35.0–45.0)
HEMOGLOBIN: 11.6 g/dL — AB (ref 11.7–15.5)
LYMPHS ABS: 1403 {cells}/uL (ref 850–3900)
MCH: 30.1 pg (ref 27.0–33.0)
MCHC: 33.3 g/dL (ref 32.0–36.0)
MCV: 90.2 fL (ref 80.0–100.0)
MPV: 11.4 fL (ref 7.5–12.5)
Monocytes Relative: 7.1 %
NEUTROS ABS: 2861 {cells}/uL (ref 1500–7800)
Neutrophils Relative %: 56.1 %
Platelets: 169 10*3/uL (ref 140–400)
RBC: 3.86 10*6/uL (ref 3.80–5.10)
RDW: 15.3 % — ABNORMAL HIGH (ref 11.0–15.0)
Total Lymphocyte: 27.5 %
WBC: 5.1 10*3/uL (ref 3.8–10.8)
WBCMIX: 362 {cells}/uL (ref 200–950)

## 2018-06-14 LAB — TSH: TSH: 2.75 m[IU]/L (ref 0.40–4.50)

## 2018-06-14 LAB — LIPID PANEL
Cholesterol: 146 mg/dL (ref <200)
HDL: 36 mg/dL — ABNORMAL LOW (ref >50)
LDL Cholesterol (Calc): 85 mg/dL (calc)
Non-HDL Cholesterol (Calc): 110 mg/dL (calc) (ref <130)
Total CHOL/HDL Ratio: 4.1 (calc) (ref <5.0)
Triglycerides: 149 mg/dL (ref <150)

## 2018-06-14 LAB — VITAMIN D 25 HYDROXY (VIT D DEFICIENCY, FRACTURES): VIT D 25 HYDROXY: 25 ng/mL — AB (ref 30–100)

## 2018-06-14 LAB — CULTURE INDICATED

## 2018-06-14 LAB — VITAMIN B12: Vitamin B-12: 541 pg/mL (ref 200–1100)

## 2018-06-14 LAB — URIC ACID: URIC ACID, SERUM: 9.2 mg/dL — AB (ref 2.5–7.0)

## 2018-06-25 ENCOUNTER — Encounter: Payer: Self-pay | Admitting: Family Medicine

## 2018-06-25 DIAGNOSIS — D649 Anemia, unspecified: Secondary | ICD-10-CM | POA: Insufficient documentation

## 2018-07-02 ENCOUNTER — Encounter: Payer: BLUE CROSS/BLUE SHIELD | Admitting: Family Medicine

## 2018-07-05 ENCOUNTER — Ambulatory Visit: Payer: BLUE CROSS/BLUE SHIELD | Admitting: Dietician

## 2018-07-17 ENCOUNTER — Telehealth: Payer: Self-pay | Admitting: Family Medicine

## 2018-07-17 ENCOUNTER — Other Ambulatory Visit: Payer: Self-pay | Admitting: Family Medicine

## 2018-07-17 NOTE — Telephone Encounter (Signed)
Last OV: 05/31/18  Next: 07/30/18

## 2018-07-17 NOTE — Telephone Encounter (Signed)
Copied from CRM [id number]. Topic: Quick Communication - Rx Refill/Question >> Jul 17, 2018  1:13 PM Gerrianne Scale wrote: Medication: colchicine (COLCRYS) 0.6 MG tablet  pt was told at last OV 05-31-18 that medicine would have refills  Has the patient contacted their pharmacy? Yes.  Pt been waiting over a month (Agent: If no, request that the patient contact the pharmacy for the refill.) (Agent: If yes, when and what did the pharmacy advise?)pharmacy told her that they didn't have any medicine for her spoke with them today  Preferred Pharmacy (with phone number or street name):   [id number] - [place], [place] - [address] [phone] (Phone) [phone] (Fax)    Agent: Please be advised that RX refills may take up to 3 business days. We ask that you follow-up with your pharmacy.

## 2018-07-17 NOTE — Telephone Encounter (Signed)
Request has been pended and is awaiting provider review

## 2018-07-23 ENCOUNTER — Ambulatory Visit: Payer: BLUE CROSS/BLUE SHIELD | Admitting: Dietician

## 2018-07-26 ENCOUNTER — Encounter: Payer: BLUE CROSS/BLUE SHIELD | Admitting: Family Medicine

## 2018-07-30 ENCOUNTER — Encounter: Payer: Self-pay | Admitting: Family Medicine

## 2018-07-30 ENCOUNTER — Other Ambulatory Visit (HOSPITAL_COMMUNITY)
Admission: RE | Admit: 2018-07-30 | Discharge: 2018-07-30 | Disposition: A | Discharge status: Home / Self Care | Payer: BLUE CROSS/BLUE SHIELD | Source: Ambulatory Visit | Attending: Family Medicine | Admitting: Family Medicine

## 2018-07-30 ENCOUNTER — Ambulatory Visit (INDEPENDENT_AMBULATORY_CARE_PROVIDER_SITE_OTHER): Payer: BLUE CROSS/BLUE SHIELD | Admitting: Family Medicine

## 2018-07-30 VITALS — BP 142/84 | HR 77 | Temp 98.4°F | Ht 62.13 in | Wt 253.2 lb

## 2018-07-30 DIAGNOSIS — Z124 Encounter for screening for malignant neoplasm of cervix: Secondary | ICD-10-CM | POA: Diagnosis not present

## 2018-07-30 DIAGNOSIS — Z87891 Personal history of nicotine dependence: Secondary | ICD-10-CM | POA: Diagnosis not present

## 2018-07-30 DIAGNOSIS — Z0000 Encounter for general adult medical examination without abnormal findings: Secondary | ICD-10-CM | POA: Insufficient documentation

## 2018-07-30 DIAGNOSIS — Z8052 Family history of malignant neoplasm of bladder: Secondary | ICD-10-CM | POA: Insufficient documentation

## 2018-07-30 DIAGNOSIS — Z803 Family history of malignant neoplasm of breast: Secondary | ICD-10-CM | POA: Diagnosis not present

## 2018-07-30 MED ORDER — LOSARTAN POTASSIUM 25 MG PO TABS: 25.0000 mg | ORAL_TABLET | Freq: Every day | ORAL | 0 refills | Status: DC

## 2018-07-30 NOTE — Assessment & Plan Note (Signed)
Pap smear today. 

## 2018-07-30 NOTE — Progress Notes (Signed)
BP (!) 142/84   Pulse 77   Temp 98.4 F (36.9 C) (Oral)   Ht 5' 2.13" (1.578 m)   Wt 253 lb 3.2 oz (114.9 kg)   SpO2 95%   BMI 46.12 kg/m    Subjective:    Patient ID: Amber Weiss, female    DOB: Apr 08, 1961, 57 y.o.   MRN: [id number]  HPI: Amber Weiss is a 57 y.o. female  Chief Complaint  Patient presents with  . Annual Exam    HPI  Here for CPE Feels like BP has been up; not checking at home, no changes in lifestyle No new stress  USPSTF grade A and B recommendations Depression:  Depression screen [facility] 2/9 07/30/2018 05/31/2018 04/06/2017 05/23/2016  Decreased Interest 0 0 0 0  Down, Depressed, Hopeless 0 0 0 0  PHQ - 2 Score 0 0 0 0  Altered sleeping 1 - - -  Tired, decreased energy 1 - - -  Change in appetite 1 - - -  Feeling bad or failure about yourself  0 - - -  Trouble concentrating 1 - - -  Moving slowly or fidgety/restless 0 - - -  Suicidal thoughts 0 - - -  PHQ-9 Score 4 - - -  Difficult doing work/chores Not difficult at all - - -   Hypertension: BP Readings from Last 3 Encounters:  07/30/18 (!) 142/84  05/31/18 124/78  05/02/17 136/72   Obesity: Wt Readings from Last 3 Encounters:  07/30/18 253 lb 3.2 oz (114.9 kg)  05/31/18 252 lb (114.3 kg)  05/02/17 242 lb 3.2 oz (109.9 kg)   BMI Readings from Last 3 Encounters:  07/30/18 46.12 kg/m  05/31/18 43.26 kg/m  05/02/17 41.57 kg/m     Skin cancer: has appt with derm; dark mole right arm, new light mole posterior RIGHT leg; going to see derm soon Lung cancer:  Not current, former smoker, quit 25 years ago Breast cancer: ordered mammo; no lumps Colorectal cancer: colonoscopy at 2012; no fam hx Cervical cancer screening: today; abnormal pap smear, years ago BRCA gene screening: family hx of breast and/or ovarian cancer and/or metastatic prostate cancer? Breast cancer only (first degree relative) HIV, hep B, hep C: not intersted STD testing and prevention (chl/gon/syphilis): no  d/c Intimate partner violence: no abuse Contraception: n/a Osteoporosis: no thin bones; start at age 46 Fall prevention/vitamin D: discussed Immunizations: shingles vaccine discussed Diet: likes everything Exercise: has started Alcohol:    Office Visit from 07/30/2018 in [facility]  AUDIT-C Score  0     Tobacco use: former AAA: n/a Aspirin: The 10-year ASCVD risk score Mikey Bussing DC Jr., et al., 2013) is: 4.3%   Values used to calculate the score:     Age: 51 years     Sex: Female     Is Non-Hispanic African American: No     Diabetic: No     Tobacco smoker: No     Systolic Blood Pressure: 562 mmHg     Is BP treated: Yes     HDL Cholesterol: 36 mg/dL     Total Cholesterol: 146 mg/dL  Glucose:  Glucose  Date Value Ref Range Status  07/24/2014 129 (H) 65 - 99 mg/dL Final   Glucose, Bld  Date Value Ref Range Status  06/12/2018 89 65 - 99 mg/dL Final    Comment:    .            Fasting reference interval .  04/06/2017 89 65 - 99 mg/dL Final  06/10/2016 102 (H) 65 - 99 mg/dL Final   Lipids:  Lab Results  Component Value Date   CHOL 146 06/12/2018   CHOL 150 06/10/2016   CHOL 156 09/30/2015   Lab Results  Component Value Date   HDL 36 (L) 06/12/2018   HDL 38 (L) 06/10/2016   HDL 38 (L) 09/30/2015   Lab Results  Component Value Date   LDLCALC 85 06/12/2018   LDLCALC 70 06/10/2016   LDLCALC 82 09/30/2015   Lab Results  Component Value Date   TRIG 149 06/12/2018   TRIG 211 (H) 06/10/2016   TRIG 178 (H) 09/30/2015   Lab Results  Component Value Date   CHOLHDL 4.1 06/12/2018   CHOLHDL 3.9 06/10/2016   No results found for: LDLDIRECT   Depression screen [facility] 2/9 07/30/2018 05/31/2018 04/06/2017 05/23/2016  Decreased Interest 0 0 0 0  Down, Depressed, Hopeless 0 0 0 0  PHQ - 2 Score 0 0 0 0  Altered sleeping 1 - - -  Tired, decreased energy 1 - - -  Change in appetite 1 - - -  Feeling bad or failure about yourself  0 - - -  Trouble  concentrating 1 - - -  Moving slowly or fidgety/restless 0 - - -  Suicidal thoughts 0 - - -  PHQ-9 Score 4 - - -  Difficult doing work/chores Not difficult at all - - -    Relevant past medical, surgical, family and social history reviewed Past Medical History:  Diagnosis Date  . Asthma   . Gout   . Hypertension   . OSA (obstructive sleep apnea)    Past Surgical History:  Procedure Laterality Date  . CESAREAN SECTION    . COLONOSCOPY  06/09/11   Family History  Problem Relation Age of Onset  . Cancer Mother        breast  . Hypertension Mother   . Emphysema Mother   . Cancer Father        skin  . Diabetes Father   . Cancer Sister        bladder  . Heart disease Neg Hx   . Stroke Neg Hx    Social History   Tobacco Use  . Smoking status: Former Smoker    Packs/day: 1.00    Years: 12.00    Pack years: 12.00    Types: Cigarettes    Last attempt to quit: 09/29/1992    Years since quitting: 25.8  . Smokeless tobacco: Never Used  Substance Use Topics  . Alcohol use: No  . Drug use: No    Interim medical history since last visit reviewed. Allergies and medications reviewed  Review of Systems Per HPI unless specifically indicated above     Objective:    BP (!) 142/84   Pulse 77   Temp 98.4 F (36.9 C) (Oral)   Ht 5' 2.13" (1.578 m)   Wt 253 lb 3.2 oz (114.9 kg)   SpO2 95%   BMI 46.12 kg/m   Wt Readings from Last 3 Encounters:  07/30/18 253 lb 3.2 oz (114.9 kg)  05/31/18 252 lb (114.3 kg)  05/02/17 242 lb 3.2 oz (109.9 kg)    Physical Exam  Constitutional: She appears well-developed and well-nourished.  Morbidly obese  HENT:  Head: Normocephalic and atraumatic.  Eyes: Conjunctivae and EOM are normal. Right eye exhibits no hordeolum. Left eye exhibits no hordeolum. No scleral icterus.  Neck: Carotid bruit  is not present. No thyromegaly present.  Cardiovascular: Normal rate, regular rhythm, S1 normal, S2 normal and normal heart sounds.  No  extrasystoles are present.  Pulmonary/Chest: Effort normal and breath sounds normal. No respiratory distress. Right breast exhibits no inverted nipple, no mass, no nipple discharge, no skin change and no tenderness. Left breast exhibits no inverted nipple, no mass, no nipple discharge, no skin change and no tenderness. Breasts are symmetrical.  Abdominal: Soft. Normal appearance and bowel sounds are normal. She exhibits no distension, no abdominal bruit, no pulsatile midline mass and no mass. There is no hepatosplenomegaly. There is no tenderness. No hernia.  Genitourinary: Uterus normal. Pelvic exam was performed with patient prone. There is no rash or lesion on the right labia. There is no rash or lesion on the left labia. Cervix exhibits no motion tenderness. Right adnexum displays no mass, no tenderness and no fullness. Left adnexum displays no mass, no tenderness and no fullness.  Musculoskeletal: Normal range of motion. She exhibits no edema.  Lymphadenopathy:       Head (right side): No submandibular adenopathy present.       Head (left side): No submandibular adenopathy present.    She has no cervical adenopathy.    She has no axillary adenopathy.  Neurological: She is alert. She displays no tremor. No cranial nerve deficit. She exhibits normal muscle tone. Gait normal.  Skin: Skin is warm and dry. No bruising and no ecchymosis noted. No cyanosis. No pallor.  Psychiatric: Her speech is normal and behavior is normal. Thought content normal. Her mood appears not anxious. She does not exhibit a depressed mood.       Assessment & Plan:   Problem List Items Addressed This Visit      Other   Preventative health care - Primary    USPSTF grade A and B recommendations reviewed with patient; age-appropriate recommendations, preventive care, screening tests, etc discussed and encouraged; healthy living encouraged; see AVS for patient education given to patient      Cervical cancer screening     Pap smear today      Relevant Orders   Cytology - PAP (Completed)    Other Visit Diagnoses    Family hx-breast malignancy       Relevant Orders   Ambulatory referral to Genetics   Family history of bladder cancer       Relevant Orders   Ambulatory referral to Genetics       Follow up plan: Return in about 1 year (around 07/31/2019) for complete physical; 2 weeks wtih CMA for BP recheck and BMP.  An after-visit summary was printed and given to the patient at [place].  Please see the patient instructions which may contain other information and recommendations beyond what is mentioned above in the assessment and plan.  Meds ordered this encounter  Medications  . DISCONTD: losartan (COZAAR) 25 MG tablet    Sig: Take 1 tablet (25 mg total) by mouth daily.    Dispense:  30 tablet    Refill:  0    Orders Placed This Encounter  Procedures  . Ambulatory referral to [facility]

## 2018-07-30 NOTE — Assessment & Plan Note (Signed)
USPSTF grade A and B recommendations reviewed with patient; age-appropriate recommendations, preventive care, screening tests, etc discussed and encouraged; healthy living encouraged; see AVS for patient education given to patient  

## 2018-07-30 NOTE — Patient Instructions (Addendum)
Try to follow the DASH guidelines (DASH stands for Dietary Approaches to Stop Hypertension). Try to limit the sodium in your diet to no more than 1,500mg  of sodium per day. Certainly try to not exceed 2,000 mg per day at the very most. Do not add salt when cooking or at the table.  Check the sodium amount on labels when shopping, and choose items lower in sodium when given a choice. Avoid or limit foods that already contain a lot of sodium. Eat a diet rich in fruits and vegetables and whole grains, and try to lose weight if overweight or obese   Check out the information at familydoctor.org entitled "Nutrition for Weight Loss: What You Need to Know about Fad Diets" Try to lose between 1-2 pounds per week by taking in fewer calories and burning off more calories You can succeed by limiting portions, limiting foods dense in calories and fat, becoming more active, and drinking 8 glasses of water a day (64 ounces) Don't skip meals, especially breakfast, as skipping meals may alter your metabolism Do not use over-the-counter weight loss pills or gimmicks that claim rapid weight loss A healthy BMI (or body mass index) is between 18.5 and 24.9 You can calculate your ideal BMI at the NIH website JobEconomics.hu  Consider getting the new shingles vaccine called Shingrix; that is available for individuals 47 years of age and older, and is recommended even if you have had shingles in the past and/or already received the old shingles vaccine (Zostavax); it is a two-part series, and is available at many local pharmacies   DASH Eating Plan DASH stands for "Dietary Approaches to Stop Hypertension." The DASH eating plan is a healthy eating plan that has been shown to reduce high blood pressure (hypertension). It may also reduce your risk for type 2 diabetes, heart disease, and stroke. The DASH eating plan may also help with weight loss. What are tips for following  this plan? General guidelines  Avoid eating more than 2,300 mg (milligrams) of salt (sodium) a day. If you have hypertension, you may need to reduce your sodium intake to 1,500 mg a day.  Limit alcohol intake to no more than 1 drink a day for nonpregnant women and 2 drinks a day for men. One drink equals 12 oz of beer, 5 oz of wine, or 1 oz of hard liquor.  Work with your health care provider to maintain a healthy body weight or to lose weight. Ask what an ideal weight is for you.  Get at least 30 minutes of exercise that causes your heart to beat faster (aerobic exercise) most days of the week. Activities may include walking, swimming, or biking.  Work with your health care provider or diet and nutrition specialist (dietitian) to adjust your eating plan to your individual calorie needs. Reading food labels  Check food labels for the amount of sodium per serving. Choose foods with less than 5 percent of the Daily Value of sodium. Generally, foods with less than 300 mg of sodium per serving fit into this eating plan.  To find whole grains, look for the word "whole" as the first word in the ingredient list. Shopping  Buy products labeled as "low-sodium" or "no salt added."  Buy fresh foods. Avoid canned foods and premade or frozen meals. Cooking  Avoid adding salt when cooking. Use salt-free seasonings or herbs instead of table salt or sea salt. Check with your health care provider or pharmacist before using salt substitutes.  Do not  fry foods. Cook foods using healthy methods such as baking, boiling, grilling, and broiling instead.  Cook with heart-healthy oils, such as olive, canola, soybean, or sunflower oil. Meal planning   Eat a balanced diet that includes: ? 5 or more servings of fruits and vegetables each day. At each meal, try to fill half of your plate with fruits and vegetables. ? Up to 6-8 servings of whole grains each day. ? Less than 6 oz of lean meat, poultry, or fish  each day. A 3-oz serving of meat is about the same size as a deck of cards. One egg equals 1 oz. ? 2 servings of low-fat dairy each day. ? A serving of nuts, seeds, or beans 5 times each week. ? Heart-healthy fats. Healthy fats called Omega-3 fatty acids are found in foods such as flaxseeds and coldwater fish, like sardines, salmon, and mackerel.  Limit how much you eat of the following: ? Canned or prepackaged foods. ? Food that is high in trans fat, such as fried foods. ? Food that is high in saturated fat, such as fatty meat. ? Sweets, desserts, sugary drinks, and other foods with added sugar. ? Full-fat dairy products.  Do not salt foods before eating.  Try to eat at least 2 vegetarian meals each week.  Eat more home-cooked food and less restaurant, buffet, and fast food.  When eating at a restaurant, ask that your food be prepared with less salt or no salt, if possible. What foods are recommended? The items listed may not be a complete list. Talk with your dietitian about what dietary choices are best for you. Grains Whole-grain or whole-wheat bread. Whole-grain or whole-wheat pasta. Brown rice. Orpah Cobb. Bulgur. Whole-grain and low-sodium cereals. Pita bread. Low-fat, low-sodium crackers. Whole-wheat flour tortillas. Vegetables Fresh or frozen vegetables (raw, steamed, roasted, or grilled). Low-sodium or reduced-sodium tomato and vegetable juice. Low-sodium or reduced-sodium tomato sauce and tomato paste. Low-sodium or reduced-sodium canned vegetables. Fruits All fresh, dried, or frozen fruit. Canned fruit in natural juice (without added sugar). Meat and other protein foods Skinless chicken or [place]. Ground chicken or [place]. Pork with fat trimmed off. Fish and seafood. Egg whites. Dried beans, peas, or lentils. Unsalted nuts, nut butters, and seeds. Unsalted canned beans. Lean cuts of beef with fat trimmed off. Low-sodium, lean deli meat. Dairy Low-fat (1%) or fat-free  (skim) milk. Fat-free, low-fat, or reduced-fat cheeses. Nonfat, low-sodium ricotta or cottage cheese. Low-fat or nonfat yogurt. Low-fat, low-sodium cheese. Fats and oils Soft margarine without trans fats. Vegetable oil. Low-fat, reduced-fat, or light mayonnaise and salad dressings (reduced-sodium). Canola, safflower, olive, soybean, and sunflower oils. Avocado. Seasoning and other foods Herbs. Spices. Seasoning mixes without salt. Unsalted popcorn and pretzels. Fat-free sweets. What foods are not recommended? The items listed may not be a complete list. Talk with your dietitian about what dietary choices are best for you. Grains Baked goods made with fat, such as croissants, muffins, or some breads. Dry pasta or rice meal packs. Vegetables Creamed or fried vegetables. Vegetables in a cheese sauce. Regular canned vegetables (not low-sodium or reduced-sodium). Regular canned tomato sauce and paste (not low-sodium or reduced-sodium). Regular tomato and vegetable juice (not low-sodium or reduced-sodium). Rosita Fire. Olives. Fruits Canned fruit in a light or heavy syrup. Fried fruit. Fruit in cream or butter sauce. Meat and other protein foods Fatty cuts of meat. Ribs. Fried meat. Tomasa Blase. Sausage. Bologna and other processed lunch meats. Salami. Fatback. Hotdogs. Bratwurst. Salted nuts and seeds. Canned beans with added  salt. Canned or smoked fish. Whole eggs or egg yolks. Chicken or [place] with skin. Dairy Whole or 2% milk, cream, and half-and-half. Whole or full-fat cream cheese. Whole-fat or sweetened yogurt. Full-fat cheese. Nondairy creamers. Whipped toppings. Processed cheese and cheese spreads. Fats and oils Butter. Stick margarine. Lard. Shortening. Ghee. Bacon fat. Tropical oils, such as coconut, palm kernel, or palm oil. Seasoning and other foods Salted popcorn and pretzels. Onion salt, garlic salt, seasoned salt, table salt, and sea salt. Worcestershire sauce. Tartar sauce. Barbecue sauce.  Teriyaki sauce. Soy sauce, including reduced-sodium. Steak sauce. Canned and packaged gravies. Fish sauce. Oyster sauce. Cocktail sauce. Horseradish that you find on the shelf. Ketchup. Mustard. Meat flavorings and tenderizers. Bouillon cubes. Hot sauce and Tabasco sauce. Premade or packaged marinades. Premade or packaged taco seasonings. Relishes. Regular salad dressings. Where to find more information:  National Heart, Lung, and Blood Institute: PopSteam.iswww.nhlbi.nih.gov  American Heart Association: www.heart.org Summary  The DASH eating plan is a healthy eating plan that has been shown to reduce high blood pressure (hypertension). It may also reduce your risk for type 2 diabetes, heart disease, and stroke.  With the DASH eating plan, you should limit salt (sodium) intake to 2,300 mg a day. If you have hypertension, you may need to reduce your sodium intake to 1,500 mg a day.  When on the DASH eating plan, aim to eat more fresh fruits and vegetables, whole grains, lean proteins, low-fat dairy, and heart-healthy fats.  Work with your health care provider or diet and nutrition specialist (dietitian) to adjust your eating plan to your individual calorie needs. This information is not intended to replace advice given to you by your health care provider. Make sure you discuss any questions you have with your health care provider. Document Released: 10/20/2011 Document Revised: 10/24/2016 Document Reviewed: 10/24/2016 Elsevier Interactive Patient Education  [employer]2018 Elsevier Inc.

## 2018-08-01 ENCOUNTER — Telehealth: Payer: Self-pay

## 2018-08-01 LAB — CYTOLOGY - PAP
Diagnosis: NEGATIVE
HPV: NOT DETECTED

## 2018-08-01 MED ORDER — METOPROLOL SUCCINATE ER 25 MG PO TB24
12.5000 mg | ORAL_TABLET | Freq: Every day | ORAL | 0 refills | Status: DC
Start: 2018-08-01 — End: 2018-11-05

## 2018-08-01 NOTE — Telephone Encounter (Signed)
I called patient; discussed symptoms Stop losartan Reviewed last BP and pulse Start low dose b-blocker, Rx sent in Recheck BP and pulse in one week

## 2018-08-01 NOTE — Telephone Encounter (Signed)
Pt states she feels like the Losartan (new med) is making her feet and ankles swell? Also making gout flare? Please advise

## 2018-08-14 ENCOUNTER — Ambulatory Visit: Payer: BLUE CROSS/BLUE SHIELD

## 2018-08-21 ENCOUNTER — Other Ambulatory Visit: Payer: Self-pay | Admitting: Family Medicine

## 2018-08-25 ENCOUNTER — Other Ambulatory Visit: Payer: Self-pay | Admitting: Family Medicine

## 2018-08-27 ENCOUNTER — Ambulatory Visit: Payer: BLUE CROSS/BLUE SHIELD

## 2018-08-27 VITALS — BP 140/84 | HR 65

## 2018-08-27 DIAGNOSIS — I10 Essential (primary) hypertension: Secondary | ICD-10-CM

## 2018-08-27 NOTE — Progress Notes (Signed)
Patient here for blood Pressure check since starting new med metoprolol 25mg  half a pill, she is also on amlodipine 10mg .  Patient denies any side effects and blood pressure today is 140/84 and pulse 65.  I consulted with Lanora Manis NP and we will continue current regimen.  Dash diet given.

## 2018-08-30 ENCOUNTER — Inpatient Hospital Stay: Payer: BLUE CROSS/BLUE SHIELD

## 2018-08-30 ENCOUNTER — Inpatient Hospital Stay: Payer: BLUE CROSS/BLUE SHIELD | Attending: Oncology | Admitting: Genetics

## 2018-09-16 ENCOUNTER — Other Ambulatory Visit: Payer: Self-pay | Admitting: Family Medicine

## 2018-10-04 IMAGING — CT CT ABD-PELV W/ CM
1 of 3 series · 14 of 32 positions shown, 19 images · IV contrast (APPLIED)
Comparison: None.

CLINICAL DATA: Unintentional weight loss. Abdominal discomfort and
bloating.

EXAM:
CT ABDOMEN AND PELVIS WITH CONTRAST
TECHNIQUE: Multidetector CT imaging of the abdomen and pelvis was performed
using the standard protocol following bolus administration of
intravenous contrast.
CONTRAST:  100 cc Isovue 370.

[Series 2: axial st · axial · 0.86mm/px · z∈[-1018,-598]mm · 14 of 96 slices shown, 19 images]
[im 6/96  soft-tissue]
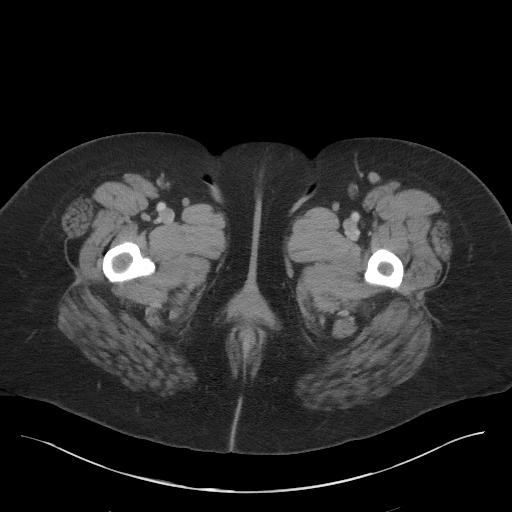
[im 6/96  bone]
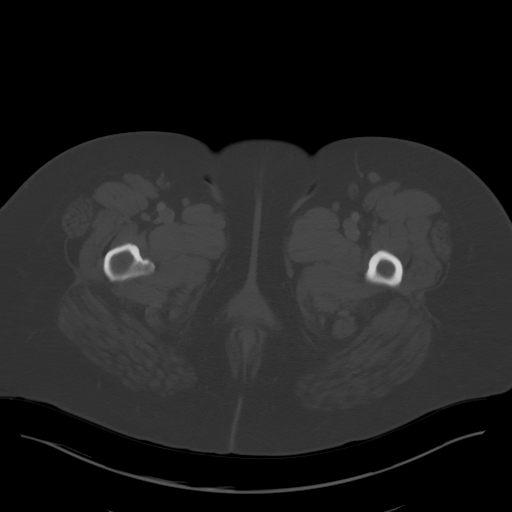
[im 11/96  soft-tissue]
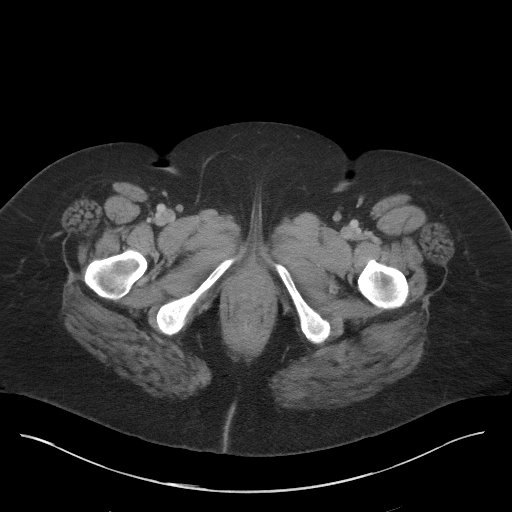
[im 22/96  soft-tissue]
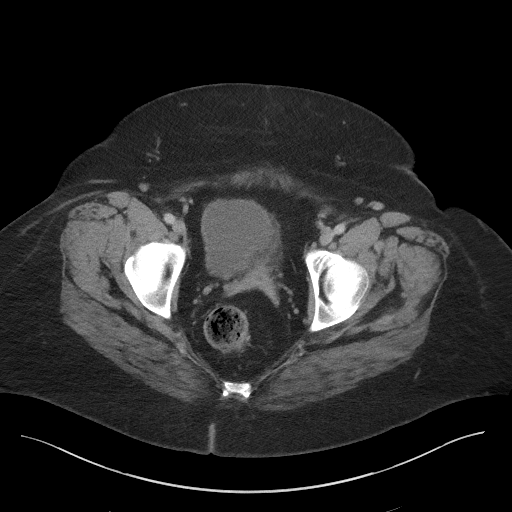
[im 27/96  soft-tissue]
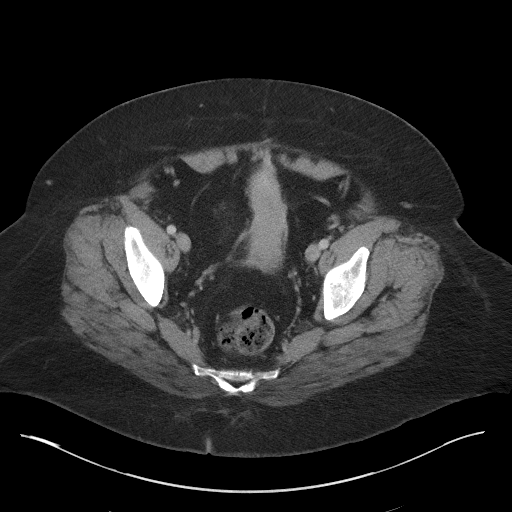
[im 32/96  soft-tissue]
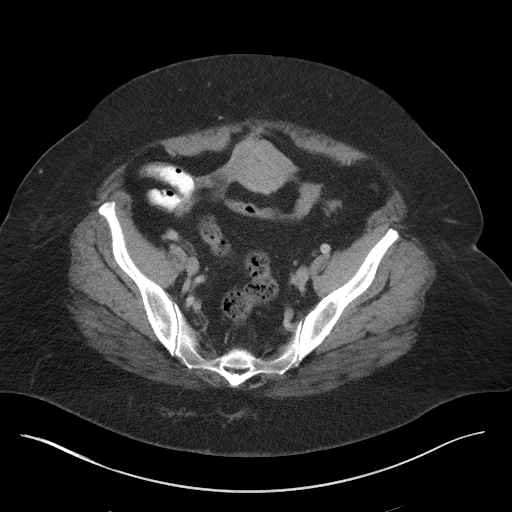
[im 43/96  soft-tissue]
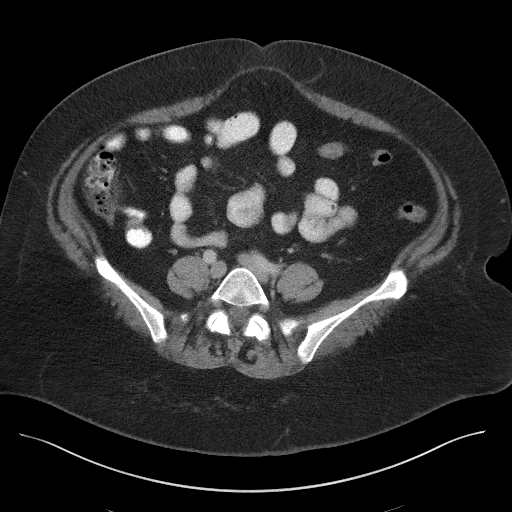
[im 48/96  soft-tissue]
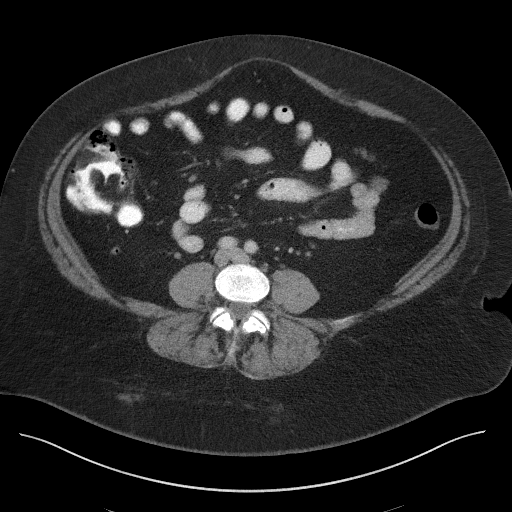
[im 53/96  soft-tissue]
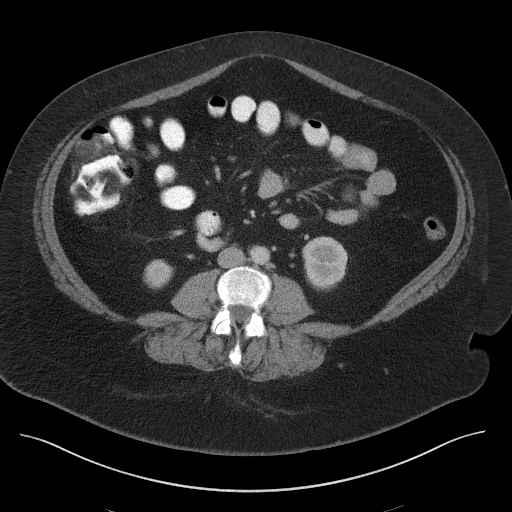
[im 64/96  soft-tissue]
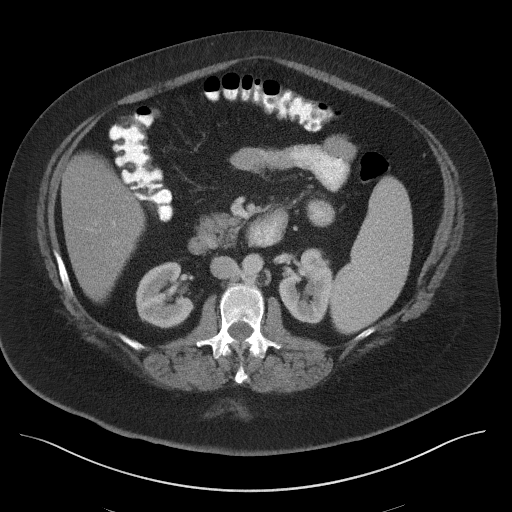
[im 64/96  bone]
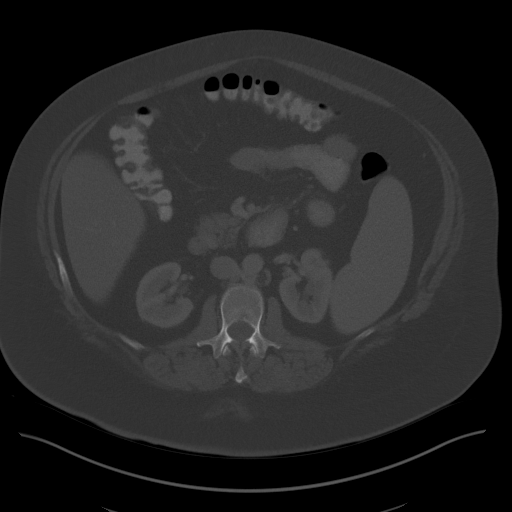
[im 69/96  soft-tissue]
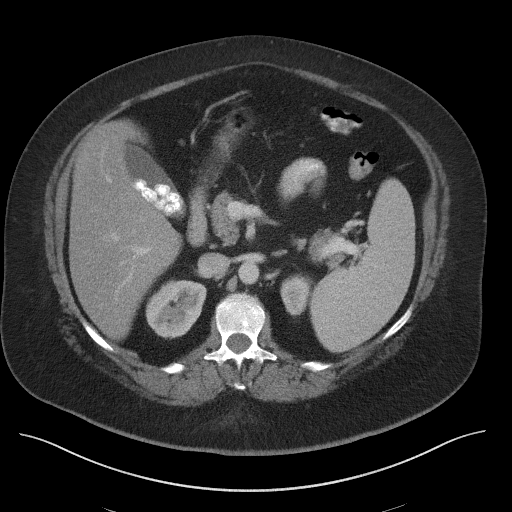
[im 74/96  soft-tissue]
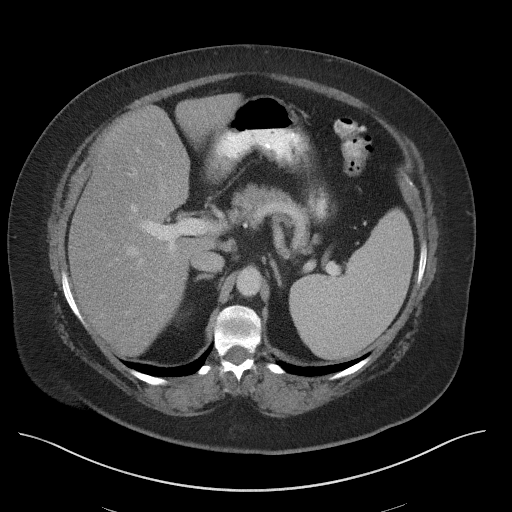
[im 74/96  lung]
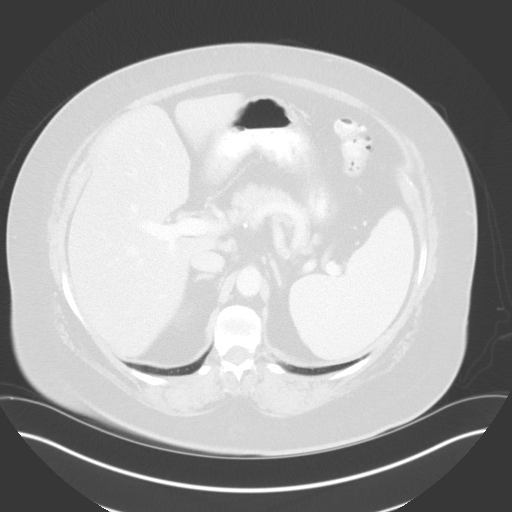
[im 80/96  lung]
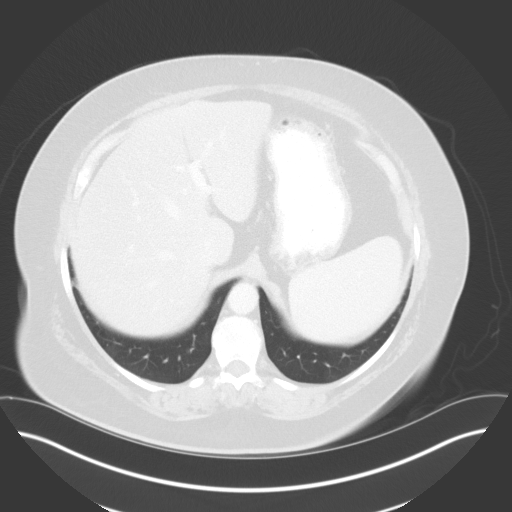
[im 85/96  soft-tissue]
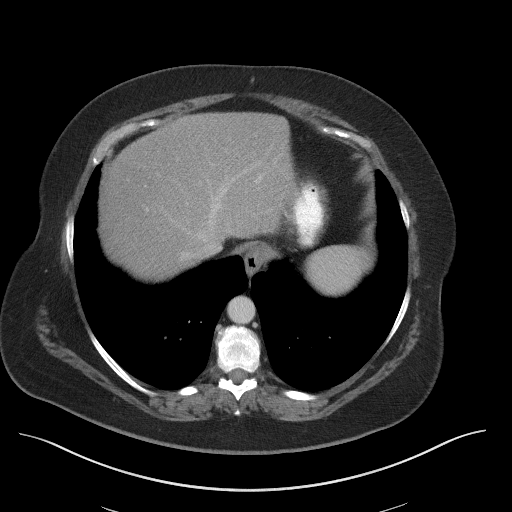
[im 85/96  lung]
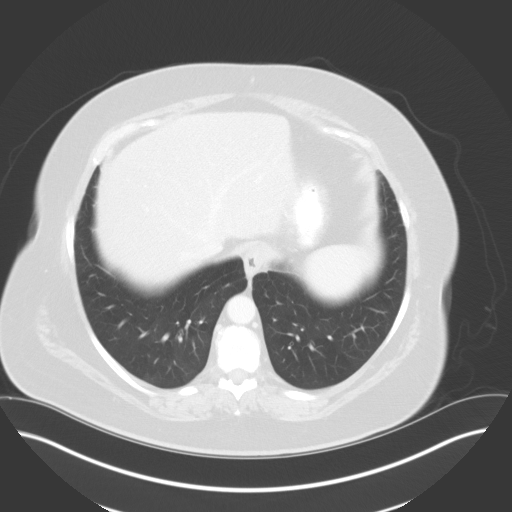
[im 90/96  soft-tissue]
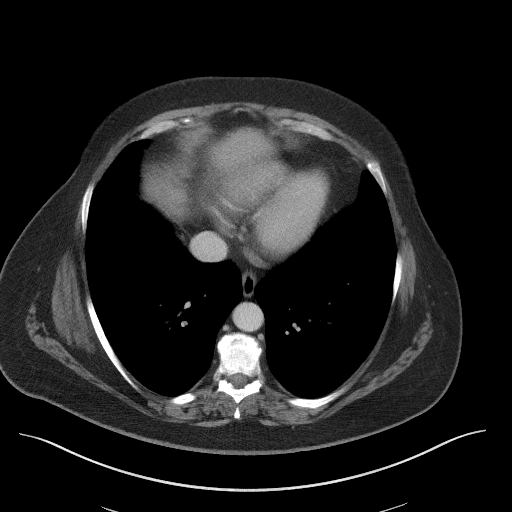
[im 90/96  lung]
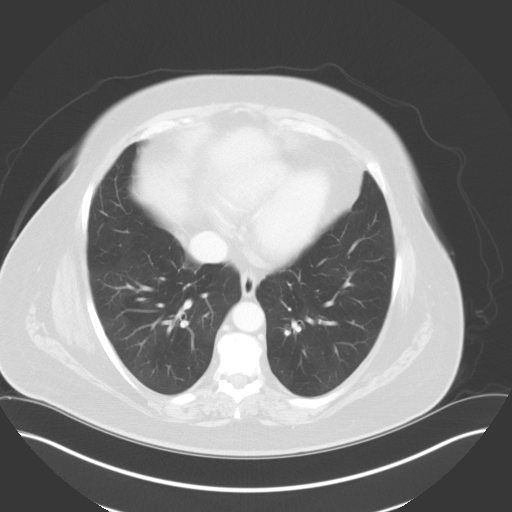

[14 of 32 positions shown; findings below may reference images not displayed]

FINDINGS: Lower chest: Lung bases show no acute findings. Heart is at the
upper limits of normal in size to mildly enlarged. No pericardial or
pleural effusion.

Hepatobiliary: Liver is decreased in attenuation diffusely. Stones
are seen in the gallbladder. No biliary ductal dilatation.

Pancreas: Negative.

Spleen: Measures 15.1 cm.

Adrenals/Urinary Tract: Adrenal glands are unremarkable. 1.2 cm
low-attenuation lesion in the right kidney is too small to
characterize but statistically, a cyst is most likely. Ureters are
decompressed. Bladder is grossly unremarkable.

Stomach/Bowel: Stomach, small bowel, appendix and colon are
unremarkable.

Vascular/Lymphatic: Vascular structures are unremarkable. No
pathologically enlarged lymph nodes.

Reproductive: Uterus is visualized.  No adnexal mass.

Other: No free fluid.  Mesenteries and peritoneum are unremarkable.

Musculoskeletal: No worrisome lytic or sclerotic lesions.
IMPRESSION: 1. No findings to explain the patient's given symptoms.
2. Hepatic steatosis.
3. Cholelithiasis.
4. Mild splenomegaly.

## 2018-11-05 ENCOUNTER — Other Ambulatory Visit: Payer: Self-pay | Admitting: Family Medicine

## 2019-01-31 ENCOUNTER — Other Ambulatory Visit: Payer: Self-pay | Admitting: Family Medicine

## 2019-02-18 ENCOUNTER — Other Ambulatory Visit: Payer: Self-pay | Admitting: Nurse Practitioner

## 2019-02-18 NOTE — Telephone Encounter (Signed)
Current COVID-19 outbreak; known chronic problem Refills approved

## 2019-04-10 ENCOUNTER — Ambulatory Visit: Payer: Self-pay | Admitting: *Deleted

## 2019-04-10 NOTE — Telephone Encounter (Signed)
Pt called stating she was asked to participate with the "skinny tea" and "skinny coffee" aka "skinny brew". She has high BP and asking if this would be safe for her; she says that she would like to do this as a weight loss tool; advised pt that studies have shown that caffeine from coffee and tea can cause a short lived immediate increase in BP; also explained to pt that studies have shown that with persistent administration of caffeine can cause a pressor effect; therefore it avoidance should be attempted with patients with hypertension (Textbook of Natural Medicine, 2013, from [employer] Keys in Nursing); the pt would like to send a list of ingredients of the beverages for review to see if they are harmful; pt instructed that this list can be sent via my chart; she verbalizes understanding; she normally sees Dr Sherie Don, [facility]; will route to office for notification.  Reason for Disposition . General information question, no triage required and triager able to answer question  Protocols used: INFORMATION ONLY CALL-A-AH

## 2019-04-25 ENCOUNTER — Other Ambulatory Visit: Payer: Self-pay | Admitting: Family Medicine

## 2019-04-25 NOTE — Telephone Encounter (Signed)
appt scheduled

## 2019-04-25 NOTE — Telephone Encounter (Signed)
Please call to schedule routine follow up appt.

## 2019-05-06 ENCOUNTER — Encounter: Payer: Self-pay | Admitting: Nurse Practitioner

## 2019-05-06 ENCOUNTER — Other Ambulatory Visit: Payer: Self-pay

## 2019-05-06 ENCOUNTER — Ambulatory Visit (INDEPENDENT_AMBULATORY_CARE_PROVIDER_SITE_OTHER): Payer: BLUE CROSS/BLUE SHIELD | Admitting: Nurse Practitioner

## 2019-05-06 VITALS — BP 154/87 | HR 68 | Resp 16

## 2019-05-06 DIAGNOSIS — J4540 Moderate persistent asthma, uncomplicated: Secondary | ICD-10-CM

## 2019-05-06 DIAGNOSIS — M1A09X0 Idiopathic chronic gout, multiple sites, without tophus (tophi): Secondary | ICD-10-CM

## 2019-05-06 DIAGNOSIS — G4733 Obstructive sleep apnea (adult) (pediatric): Secondary | ICD-10-CM

## 2019-05-06 DIAGNOSIS — K760 Fatty (change of) liver, not elsewhere classified: Secondary | ICD-10-CM

## 2019-05-06 DIAGNOSIS — I10 Essential (primary) hypertension: Secondary | ICD-10-CM

## 2019-05-06 DIAGNOSIS — E786 Lipoprotein deficiency: Secondary | ICD-10-CM

## 2019-05-06 DIAGNOSIS — H9211 Otorrhea, right ear: Secondary | ICD-10-CM

## 2019-05-06 MED ORDER — AMLODIPINE BESYLATE 10 MG PO TABS: 10.0000 mg | ORAL_TABLET | Freq: Every day | ORAL | 1 refills | Status: DC

## 2019-05-06 MED ORDER — METOPROLOL SUCCINATE ER 25 MG PO TB24: 12.5000 mg | ORAL_TABLET | Freq: Every day | ORAL | 1 refills | Status: DC

## 2019-05-06 MED ORDER — COLCHICINE 0.6 MG PO TABS: ORAL_TABLET | ORAL | 1 refills | Status: DC

## 2019-05-06 NOTE — Progress Notes (Signed)
Virtual Visit via Video Note  I connected with Amber Weiss on 05/06/19 at  1:20 PM EDT by a video enabled telemedicine application and verified that I am speaking with the correct person using two identifiers.   Staff discussed the limitations of evaluation and management by telemedicine and the availability of in person appointments. The patient expressed understanding and agreed to proceed.  Patient location: home  My location: work office Other people present: none HPI  Patient endorses drainage from right ear intermittently- clear discharge. Denies ear pain. Endorses muffled sounds occasional and whistling sound when sneezing. States she did use a bobby pin to clear out her ear. No fevers, foul smell, tenderness or tinnitus.    Hypertension Patient is on amlodipine 10mg  daily, metoprolol 12.5mg  daily.  Takes medications as prescribed with no missed doses a month.  Not very compliant with low-salt diet, especially during the pandemic.  States she has been getting some headache and thinks her blood pressure is getting high but has not checked it.  Denies chest pain, blurry vision.  Asthma Patient is on Symbicort 2 puffs BID and albuterol PRN Symptoms happen   <2 days a week Nighttime awakenings 0 Rescue inhaler Use <2 days a week Interference with normal activities none Exacerbations requiring systemic corticosteroids 0-1  She has OSA and wears CPAP with 100% self-reported compliance  Gout She gets gout flares in her big toe or heel. She states he last flare was last week,. She takes colchicine and it resolves, happens yearly when she vacations at the beach.    PHQ2/9: Depression screen [facility] 2/[facility] 05/06/2019 07/30/2018 05/31/2018 04/06/2017 05/23/2016  Decreased Interest 0 0 0 0 0  Down, Depressed, Hopeless 0 0 0 0 0  PHQ - 2 Score 0 0 0 0 0  Altered sleeping 0 1 - - -  Tired, decreased energy 0 1 - - -  Change in appetite 0 1 - - -  Feeling bad or failure about yourself  0 0 - -  -  Trouble concentrating 0 1 - - -  Moving slowly or fidgety/restless 0 0 - - -  Suicidal thoughts 0 0 - - -  PHQ-9 Score 0 4 - - -  Difficult doing work/chores Not difficult at all Not difficult at all - - -    PHQ reviewed. Negative  Patient Active Problem List   Diagnosis Date Noted  . Cervical cancer screening 07/30/2018  . Anemia 06/25/2018  . Fatty liver 05/31/2018  . Splenomegaly 05/31/2018  . Kidney lesion 05/02/2017  . Cardiomegaly 05/02/2017  . Daytime somnolence 06/10/2016  . Low HDL (under 40) 06/10/2016  . Encounter for medication monitoring 06/10/2016  . Vitamin D deficiency 12/13/2015  . Vitamin B12 deficiency 12/13/2015  . Needs flu shot 09/30/2015  . Fatigue 09/30/2015  . Morbid obesity (HCC) 09/30/2015  . Chronic gout 09/30/2015  . Preventative health care 09/30/2015  . Hypertension   . Asthma   . OSA (obstructive sleep apnea)     Past Medical History:  Diagnosis Date  . Asthma   . Gout   . Hypertension   . OSA (obstructive sleep apnea)     Past Surgical History:  Procedure Laterality Date  . CESAREAN SECTION    . COLONOSCOPY  06/09/11    Social History   Tobacco Use  . Smoking status: Former Smoker    Packs/day: 1.00    Years: 12.00    Pack years: 12.00    Types: Cigarettes  Quit date: 09/29/1992    Years since quitting: 26.6  . Smokeless tobacco: Never Used  Substance Use Topics  . Alcohol use: No     Current Outpatient Medications:  .  albuterol (PROVENTIL HFA;VENTOLIN HFA) 108 (90 BASE) MCG/ACT inhaler, Inhale 2 puffs into the lungs every 6 (six) hours as needed for wheezing or shortness of breath., Disp: , Rfl:  .  amLODipine (NORVASC) 10 MG tablet, Take 1 tablet (10 mg total) by mouth daily., Disp: 30 tablet, Rfl: 11 .  amphetamine-dextroamphetamine (ADDERALL) 5 MG tablet, Take 5 mg by mouth daily., Disp: , Rfl:  .  budesonide-formoterol (SYMBICORT) 160-4.5 MCG/ACT inhaler, Inhale 2 puffs into the lungs 2 (two) times daily as  needed. , Disp: , Rfl:  .  cholecalciferol (VITAMIN D) 1000 units tablet, Take 1,000 Units by mouth daily., Disp: , Rfl:  .  colchicine 0.6 MG tablet, TAKE 2 TABS BY MOUTH AT START OF GOUT FLARE, THEN ONE MORE ONE HOUR LATER --  MAX of 3 pills per flare/episode, Disp: 6 tablet, Rfl: 1 .  metoprolol succinate (TOPROL-XL) 25 MG 24 hr tablet, TAKE 1/2 TABLET BY MOUTH EVERY DAY, Disp: 15 tablet, Rfl: 0 .  Misc Natural Products (TART CHERRY ADVANCED PO), Take by mouth., Disp: , Rfl:  .  indomethacin (INDOCIN) 50 MG capsule, Take 1 capsule (50 mg total) by mouth 3 (three) times daily with meals. (Patient not taking: Reported on 05/06/2019), Disp: 21 capsule, Rfl: 2 .  vitamin B-12 (CYANOCOBALAMIN) 100 MCG tablet, Take 100 mcg by mouth daily., Disp: , Rfl:   Allergies  Allergen Reactions  . Penicillins     ROS   No other specific complaints in a complete review of systems (except as listed in HPI above).  Objective  There were no vitals filed for this visit.   There is no height or weight on file to calculate BMI.  Nursing Note and Vital Signs reviewed.  Physical Exam   Constitutional: Patient appears well-developed and well-nourished. No distress.  HENT: Head: Normocephalic and atraumatic. Pulmonary/Chest: Effort normal  Musculoskeletal: Normal range of motion,  Neurological: alert and oriented, speech normal.  Skin: No rash noted. No erythema.  Psychiatric: Patient has a normal mood and affect. behavior is normal. Judgment and thought content normal.    Assessment & Plan  1. Essential hypertension Discussed DASH, will come in this week for vitals recheck  - amLODipine (NORVASC) 10 MG tablet; Take 1 tablet (10 mg total) by mouth daily.  Dispense: 90 tablet; Refill: 1 - metoprolol succinate (TOPROL-XL) 25 MG 24 hr tablet; Take 0.5 tablets (12.5 mg total) by mouth daily.  Dispense: 45 tablet; Refill: 1  2. Moderate persistent asthma without complication stable  3. OSA  (obstructive sleep apnea) stable  4. Idiopathic chronic gout of multiple sites without tophus Discussed prevention  - colchicine 0.6 MG tablet; TAKE 2 TABS BY MOUTH AT START OF GOUT FLARE, THEN ONE MORE ONE HOUR LATER --  MAX of 3 pills per flare/episode  Dispense: 6 tablet; Refill: 1  5. Fatty liver Diet and exercise   6. Low HDL (under 40) Discussed diet   7. Ear drainage right Likely perforated - Ambulatory referral to ENT    Follow Up Instructions:   3 months, come in this week for vitals  I discussed the assessment and treatment plan with the patient. The patient was provided an opportunity to ask questions and all were answered. The patient agreed with the plan and demonstrated an understanding  of the instructions.   The patient was advised to call back or seek an in-person evaluation if the symptoms worsen or if the condition fails to improve as anticipated.  I provided 24 minutes of non-face-to-face time during this encounter.   Fredderick Severance, NP

## 2019-05-07 ENCOUNTER — Ambulatory Visit: Payer: Self-pay

## 2019-05-10 ENCOUNTER — Ambulatory Visit: Payer: Self-pay | Admitting: *Deleted

## 2019-05-10 NOTE — Telephone Encounter (Signed)
Summary: 100 temp    Patient has temp of 100 with no other known symptoms at this time. Requesting call back from NT.

## 2019-05-10 NOTE — Telephone Encounter (Signed)
Attempt was made to call pt but no answer.

## 2019-05-13 NOTE — Telephone Encounter (Signed)
Nurse triage pool stated that patient called and asked to have a BP check due to being turned away from [facility] for a fever greater than 100. Advised that if patient had chest pain or SOB she go to the ER but we were not allowed to have her come into the office with a fever. Nurse triage pool notified patient.

## 2019-05-28 ENCOUNTER — Telehealth: Payer: Self-pay | Admitting: Family Medicine

## 2019-05-28 NOTE — Telephone Encounter (Signed)
Pt needs another ENT specialist, she was [facility] would not treat her because she was hot from working in the yard. She was very upset and want a different local referral for ENT

## 2019-05-28 NOTE — Telephone Encounter (Signed)
Since [facility] is the only ENT that is local, the referral has been sent to Dr. Kasandra Knudsen Jonathon Bellows Benjamine Mola

## 2019-06-03 ENCOUNTER — Other Ambulatory Visit: Payer: Self-pay

## 2019-06-03 DIAGNOSIS — H9211 Otorrhea, right ear: Secondary | ICD-10-CM

## 2019-06-03 NOTE — Telephone Encounter (Signed)
Pt called in to check the status of her referral to ENT. Advised per the notes, pt says that location hasn't called to schedule.   Please assist.

## 2019-06-03 NOTE — Telephone Encounter (Signed)
Office needs time to process. If there has not been any progress by tomorrow, I will give them a call.

## 2019-07-02 ENCOUNTER — Other Ambulatory Visit: Payer: Self-pay

## 2019-07-02 DIAGNOSIS — Z20822 Contact with and (suspected) exposure to covid-19: Secondary | ICD-10-CM

## 2019-07-03 LAB — NOVEL CORONAVIRUS, NAA: SARS-CoV-2, NAA: NOT DETECTED

## 2019-07-18 ENCOUNTER — Ambulatory Visit (INDEPENDENT_AMBULATORY_CARE_PROVIDER_SITE_OTHER): Payer: BLUE CROSS/BLUE SHIELD | Admitting: Otolaryngology

## 2019-08-01 ENCOUNTER — Encounter: Payer: BLUE CROSS/BLUE SHIELD | Admitting: Family Medicine

## 2019-08-05 ENCOUNTER — Other Ambulatory Visit: Payer: Self-pay

## 2019-08-05 DIAGNOSIS — Z20822 Contact with and (suspected) exposure to covid-19: Secondary | ICD-10-CM

## 2019-08-07 LAB — NOVEL CORONAVIRUS, NAA: SARS-CoV-2, NAA: NOT DETECTED

## 2019-09-25 ENCOUNTER — Other Ambulatory Visit: Payer: Self-pay

## 2019-09-25 DIAGNOSIS — Z20822 Contact with and (suspected) exposure to covid-19: Secondary | ICD-10-CM

## 2019-09-27 LAB — NOVEL CORONAVIRUS, NAA: SARS-CoV-2, NAA: DETECTED — AB

## 2019-10-18 ENCOUNTER — Ambulatory Visit: Payer: Self-pay | Admitting: Family Medicine

## 2019-10-31 ENCOUNTER — Other Ambulatory Visit: Payer: Self-pay

## 2019-10-31 DIAGNOSIS — I10 Essential (primary) hypertension: Secondary | ICD-10-CM

## 2019-10-31 MED ORDER — AMLODIPINE BESYLATE 10 MG PO TABS: 10.0000 mg | ORAL_TABLET | Freq: Every day | ORAL | 1 refills | Status: DC

## 2019-11-25 ENCOUNTER — Other Ambulatory Visit: Payer: Self-pay | Admitting: Emergency Medicine

## 2019-11-25 DIAGNOSIS — I10 Essential (primary) hypertension: Secondary | ICD-10-CM

## 2019-11-25 MED ORDER — METOPROLOL SUCCINATE ER 25 MG PO TB24: 12.5000 mg | ORAL_TABLET | Freq: Every day | ORAL | 0 refills | Status: DC

## 2019-12-20 ENCOUNTER — Ambulatory Visit: Payer: 59 | Admitting: Family Medicine

## 2019-12-20 ENCOUNTER — Other Ambulatory Visit: Payer: Self-pay

## 2019-12-20 ENCOUNTER — Encounter: Payer: Self-pay | Admitting: Family Medicine

## 2019-12-20 VITALS — BP 148/88 | HR 85 | Temp 98.3°F | Resp 14 | Ht 62.0 in | Wt 248.7 lb

## 2019-12-20 DIAGNOSIS — I10 Essential (primary) hypertension: Secondary | ICD-10-CM

## 2019-12-20 DIAGNOSIS — M1A09X0 Idiopathic chronic gout, multiple sites, without tophus (tophi): Secondary | ICD-10-CM | POA: Diagnosis not present

## 2019-12-20 DIAGNOSIS — E559 Vitamin D deficiency, unspecified: Secondary | ICD-10-CM | POA: Diagnosis not present

## 2019-12-20 DIAGNOSIS — M79672 Pain in left foot: Secondary | ICD-10-CM

## 2019-12-20 DIAGNOSIS — Z5181 Encounter for therapeutic drug level monitoring: Secondary | ICD-10-CM

## 2019-12-20 DIAGNOSIS — R600 Localized edema: Secondary | ICD-10-CM

## 2019-12-20 DIAGNOSIS — Z1231 Encounter for screening mammogram for malignant neoplasm of breast: Secondary | ICD-10-CM

## 2019-12-20 DIAGNOSIS — J4540 Moderate persistent asthma, uncomplicated: Secondary | ICD-10-CM

## 2019-12-20 MED ORDER — METOPROLOL SUCCINATE ER 25 MG PO TB24: 12.5000 mg | ORAL_TABLET | Freq: Every day | ORAL | 1 refills | Status: DC

## 2019-12-20 NOTE — Progress Notes (Signed)
Patient ID: Amber Weiss, female    DOB: 07-Jan-1961, 59 y.o.   MRN: [id number]  PCP: Danelle Berry, PA-C  Chief Complaint  Patient presents with  . Back Pain    midline wear bra strap sits, onset months  . Edema    years, bilateral feet and ankles hx of gout    Subjective:   Amber Weiss is a 59 y.o. female, presents to clinic with CC of the following:  HPI  She has complaints of left 1st MTP joint pain swelling redness and warmth, with hx of gout   Bilateral LE edema up to knees after standing for long periods of time, having new pain on her left foot that moved under her foot.  The foot pain in new areas feels similar to her gout pain shes never had it there before.  All of left foot pain started about two month ago.   She has colchicine that she takes for flares, has not been on allopurinol before.   LE edema - no past work up, bought compression socks and "they are terrible" she wore them once for 2 hours and pain was worse than normal.  No low salt diet, trying to exercise, some discomfort when walking to calves "attributes it to not exercising when she should" No CP SOB orthopnea PND near syncope, HA, visual disturbances HTN: Doesn't monitor at home - hasn't been seen here at PCP office for HTN for almost 8 months. BP Readings from Last 3 Encounters:  12/20/19 (!) 148/88  05/06/19 (!) 154/87  08/27/18 140/84  Managed with norvasc and metoprolol   Hx of Vit D deficiency, would like rechecked today.   Patient Active Problem List   Diagnosis Date Noted  . Anemia 06/25/2018  . Fatty liver 05/31/2018  . Splenomegaly 05/31/2018  . Kidney lesion 05/02/2017  . Cardiomegaly 05/02/2017  . Low HDL (under 40) 06/10/2016  . Vitamin D deficiency 12/13/2015  . Vitamin B12 deficiency 12/13/2015  . Morbid obesity (HCC) 09/30/2015  . Chronic gout 09/30/2015  . Hypertension   . Asthma   . OSA (obstructive sleep apnea)       Current Outpatient Medications:  .   albuterol (PROVENTIL HFA;VENTOLIN HFA) 108 (90 BASE) MCG/ACT inhaler, Inhale 2 puffs into the lungs every 6 (six) hours as needed for wheezing or shortness of breath., Disp: , Rfl:  .  amLODipine (NORVASC) 10 MG tablet, Take 1 tablet (10 mg total) by mouth daily., Disp: 90 tablet, Rfl: 1 .  colchicine 0.6 MG tablet, TAKE 2 TABS BY MOUTH AT START OF GOUT FLARE, THEN ONE MORE ONE HOUR LATER --  MAX of 3 pills per flare/episode, Disp: 6 tablet, Rfl: 1 .  metoprolol succinate (TOPROL-XL) 25 MG 24 hr tablet, Take 0.5 tablets (12.5 mg total) by mouth daily., Disp: 15 tablet, Rfl: 0 .  cholecalciferol (VITAMIN D) 1000 units tablet, Take 1,000 Units by mouth daily., Disp: , Rfl:    Allergies  Allergen Reactions  . Penicillins   . Sulfa Antibiotics Other (See Comments)    Unknown reaction      Family History  Problem Relation Age of Onset  . Cancer Mother        breast  . Hypertension Mother   . Emphysema Mother   . Cancer Father        skin  . Diabetes Father   . Cancer Sister        bladder  . Heart disease Neg Hx   .  Stroke Neg Hx      Social History   Socioeconomic History  . Marital status: Married    Spouse name: Not on file  . Number of children: Not on file  . Years of education: Not on file  . Highest education level: Not on file  Occupational History  . Not on file  Tobacco Use  . Smoking status: Former Smoker    Packs/day: 1.00    Years: 12.00    Pack years: 12.00    Types: Cigarettes    Quit date: 09/29/1992    Years since quitting: 27.2  . Smokeless tobacco: Never Used  Substance and Sexual Activity  . Alcohol use: No  . Drug use: No  . Sexual activity: Not on file  Other Topics Concern  . Not on file  Social History Narrative  . Not on file   Social Determinants of Health   Financial Resource Strain:   . Difficulty of Paying Living Expenses: Not on file  Food Insecurity:   . Worried About Programme researcher, broadcasting/film/video in the Last Year: Not on file  . Ran  Out of Food in the Last Year: Not on file  Transportation Needs:   . Lack of Transportation (Medical): Not on file  . Lack of Transportation (Non-Medical): Not on file  Physical Activity:   . Days of Exercise per Week: Not on file  . Minutes of Exercise per Session: Not on file  Stress:   . Feeling of Stress : Not on file  Social Connections:   . Frequency of Communication with Friends and Family: Not on file  . Frequency of Social Gatherings with Friends and Family: Not on file  . Attends Religious Services: Not on file  . Active Member of Clubs or Organizations: Not on file  . Attends Banker Meetings: Not on file  . Marital Status: Not on file  Intimate Partner Violence:   . Fear of Current or Ex-Partner: Not on file  . Emotionally Abused: Not on file  . Physically Abused: Not on file  . Sexually Abused: Not on file    Chart Review Today: I personally reviewed active problem list, medication list, allergies, family history, social history, health maintenance, notes from last encounter, lab results, imaging with the patient/caregiver today.   Review of Systems  Constitutional: Negative.   HENT: Negative.   Eyes: Negative.   Respiratory: Negative.   Cardiovascular: Negative.   Gastrointestinal: Negative.   Endocrine: Negative.   Genitourinary: Negative.   Musculoskeletal: Negative.   Skin: Negative.   Allergic/Immunologic: Negative.   Neurological: Negative.   Hematological: Negative.   Psychiatric/Behavioral: Negative.   All other systems reviewed and are negative.      Objective:   Vitals:   12/20/19 1121  BP: (!) 148/88  Pulse: 85  Resp: 14  Temp: 98.3 F (36.8 C)  SpO2: 98%  Weight: 248 lb 11.2 oz (112.8 kg)  Height: 5\' 2"  (1.575 m)    Body mass index is 45.49 kg/m.  Physical Exam Vitals and nursing note reviewed.  Constitutional:      General: She is not in acute distress.    Appearance: Normal appearance. She is well-developed. She  is obese. She is not ill-appearing, toxic-appearing or diaphoretic.     Interventions: Face mask in place.  HENT:     Head: Normocephalic and atraumatic.     Right Ear: External ear normal.     Left Ear: External ear normal.  Eyes:  General: Lids are normal. No scleral icterus.       Right eye: No discharge.        Left eye: No discharge.     Conjunctiva/sclera: Conjunctivae normal.  Neck:     Trachea: Phonation normal. No tracheal deviation.  Cardiovascular:     Rate and Rhythm: Normal rate and regular rhythm.     Pulses: Normal pulses.          Radial pulses are 2+ on the right side and 2+ on the left side.       Posterior tibial pulses are 2+ on the right side and 2+ on the left side.     Heart sounds: Normal heart sounds. No murmur. No friction rub. No gallop.      Comments: nonpitting Pulmonary:     Effort: Pulmonary effort is normal. No respiratory distress.     Breath sounds: Normal breath sounds. No stridor. No wheezing, rhonchi or rales.  Chest:     Chest wall: No tenderness.  Abdominal:     General: Bowel sounds are normal. There is no distension.     Palpations: Abdomen is soft.     Tenderness: There is no abdominal tenderness. There is no guarding or rebound.  Musculoskeletal:        General: No deformity. Normal range of motion.     Cervical back: Normal range of motion and neck supple.     Right lower leg: Edema present.     Left lower leg: Edema present.     Left foot: Laceration present.  Feet:     Right foot:     Skin integrity: Skin integrity normal.     Left foot:     Skin integrity: Skin integrity normal. No erythema.  Lymphadenopathy:     Cervical: No cervical adenopathy.  Skin:    General: Skin is warm and dry.     Capillary Refill: Capillary refill takes less than 2 seconds.     Coloration: Skin is not jaundiced or pale.     Findings: No rash.  Neurological:     Mental Status: She is alert and oriented to person, place, and time.     Motor:  No abnormal muscle tone.     Gait: Gait normal.  Psychiatric:        Speech: Speech normal.        Behavior: Behavior normal.      Results for orders placed or performed in visit on 09/25/19  Novel Coronavirus, NAA ([facility])   Specimen: Nasopharyngeal(NP) swabs in vial transport medium   NASOPHARYNGE  TESTING  Result Value Ref Range   SARS-CoV-2, NAA Detected (A) Not Detected        Assessment & Plan:      ICD-10-CM   1. Idiopathic chronic gout of multiple sites without tophus  M1A.64Q0 Uric acid    COMPLETE METABOLIC PANEL WITH GFR   check uric acid, start and titrate allopurinol if elevated, can use indomethacin to control flares  2. Left foot pain  M79.672 Ambulatory referral to Podiatry   multiple areas of pain to left foot, feels stiff to left lateral foot, plantar foot and left 1st MTP - doesn't seem like gout flare - refer to podiatry  3. Essential hypertension  I10 COMPLETE METABOLIC PANEL WITH GFR    metoprolol succinate (TOPROL-XL) 25 MG 24 hr tablet   uncontrolled, refill meds, may want to change off amlodipine due to complaint of LE edema, checking labs today, pt new  to me, return for HTN f/up   4. Vitamin D deficiency  E55.9 VITAMIN D 25 Hydroxy (Vit-D Deficiency, Fractures)   labs and supplement if still deficient  5. Moderate persistent asthma without complication  P29.51    refill on inhaler, pt states currently well controlled  6. Bilateral lower extremity edema  O84.1 COMPLETE METABOLIC PANEL WITH GFR    Brain natriuretic peptide    CBC with Differential/Platelet    Ambulatory referral to Vascular Surgery   check labs, conservative tx, may want to d/c norvasc, vascular referral?  7. Encounter for screening mammogram for malignant neoplasm of breast  Z12.31 MM 3D SCREEN BREAST BILATERAL  8. Encounter for medication monitoring  Z51.81 Uric acid    COMPLETE METABOLIC PANEL WITH GFR    VITAMIN D 25 Hydroxy (Vit-D Deficiency, Fractures)    Brain natriuretic  peptide    CBC with Differential/Platelet     Left 1st MTP joint pain with hx of gout but worse pain recently for 2 months, stiff, sensitive to the touch, not her typical gout Also left outer foot pain that has "moved" to plantar aspect Podiatry may be helpful, we will work on controlling gout Pt declined xrays today  Le edema - tx low salt, compression stockings, increase physical activity, avoid prolonged sitting or standing Basic labs to r/o other causes, unlikely cardiac but checking BNP liver function, protein, renal function   Delsa Grana, PA-C 12/20/19 11:28 AM

## 2019-12-21 LAB — COMPLETE METABOLIC PANEL WITH GFR
AG Ratio: 1.4 (calc) (ref 1.0–2.5)
ALT: 21 U/L (ref 6–29)
AST: 16 U/L (ref 10–35)
Albumin: 4.4 g/dL (ref 3.6–5.1)
Alkaline phosphatase (APISO): 80 U/L (ref 37–153)
BUN: 15 mg/dL (ref 7–25)
CO2: 31 mmol/L (ref 20–32)
Calcium: 9.9 mg/dL (ref 8.6–10.4)
Chloride: 108 mmol/L (ref 98–110)
Creat: 0.75 mg/dL (ref 0.50–1.05)
GFR, Est African American: 102 mL/min/{1.73_m2} (ref >=60)
GFR, Est Non African American: 88 mL/min/{1.73_m2} (ref >=60)
Globulin: 3.1 g/dL (calc) (ref 1.9–3.7)
Glucose, Bld: 92 mg/dL (ref 65–99)
Potassium: 4.2 mmol/L (ref 3.5–5.3)
Sodium: 145 mmol/L (ref 135–146)
Total Bilirubin: 0.5 mg/dL (ref 0.2–1.2)
Total Protein: 7.5 g/dL (ref 6.1–8.1)

## 2019-12-21 LAB — CBC WITH DIFFERENTIAL/PLATELET
Absolute Monocytes: 422 cells/uL (ref 200–950)
Basophils Absolute: 32 cells/uL (ref 0–200)
Basophils Relative: 0.5 %
Eosinophils Absolute: 390 cells/uL (ref 15–500)
Eosinophils Relative: 6.1 %
HCT: 36.9 % (ref 35.0–45.0)
Hemoglobin: 12.1 g/dL (ref 11.7–15.5)
Lymphs Abs: 1382 cells/uL (ref 850–3900)
MCH: 28.5 pg (ref 27.0–33.0)
MCHC: 32.8 g/dL (ref 32.0–36.0)
MCV: 87 fL (ref 80.0–100.0)
MPV: 12.2 fL (ref 7.5–12.5)
Monocytes Relative: 6.6 %
Neutro Abs: 4173 cells/uL (ref 1500–7800)
Neutrophils Relative %: 65.2 %
Platelets: 166 10*3/uL (ref 140–400)
RBC: 4.24 10*6/uL (ref 3.80–5.10)
RDW: 14.4 % (ref 11.0–15.0)
Total Lymphocyte: 21.6 %
WBC: 6.4 10*3/uL (ref 3.8–10.8)

## 2019-12-21 LAB — URIC ACID: Uric Acid, Serum: 8.7 mg/dL — ABNORMAL HIGH (ref 2.5–7.0)

## 2019-12-21 LAB — BRAIN NATRIURETIC PEPTIDE: Brain Natriuretic Peptide: 44 pg/mL (ref <100)

## 2019-12-21 LAB — VITAMIN D 25 HYDROXY (VIT D DEFICIENCY, FRACTURES): Vit D, 25-Hydroxy: 14 ng/mL — ABNORMAL LOW (ref 30–100)

## 2019-12-23 ENCOUNTER — Other Ambulatory Visit: Payer: Self-pay | Admitting: Family Medicine

## 2019-12-23 DIAGNOSIS — M1A09X0 Idiopathic chronic gout, multiple sites, without tophus (tophi): Secondary | ICD-10-CM

## 2019-12-23 DIAGNOSIS — E559 Vitamin D deficiency, unspecified: Secondary | ICD-10-CM

## 2019-12-23 MED ORDER — PREDNISONE 20 MG PO TABS: ORAL_TABLET | ORAL | 0 refills | Status: DC

## 2019-12-23 MED ORDER — VITAMIN D (ERGOCALCIFEROL) 1.25 MG (50000 UNIT) PO CAPS: 50000.0000 [IU] | ORAL_CAPSULE | ORAL | 0 refills | Status: DC

## 2019-12-23 MED ORDER — ALLOPURINOL 100 MG PO TABS: 100.0000 mg | ORAL_TABLET | Freq: Every day | ORAL | 6 refills | Status: DC

## 2019-12-27 ENCOUNTER — Ambulatory Visit: Payer: Self-pay | Admitting: Podiatry

## 2019-12-29 ENCOUNTER — Encounter: Payer: Self-pay | Admitting: Family Medicine

## 2020-01-10 ENCOUNTER — Encounter (INDEPENDENT_AMBULATORY_CARE_PROVIDER_SITE_OTHER): Payer: Self-pay | Admitting: Vascular Surgery

## 2020-01-10 ENCOUNTER — Other Ambulatory Visit: Payer: Self-pay

## 2020-01-10 ENCOUNTER — Ambulatory Visit (INDEPENDENT_AMBULATORY_CARE_PROVIDER_SITE_OTHER): Payer: 59 | Admitting: Vascular Surgery

## 2020-01-10 VITALS — BP 160/90 | HR 73 | Resp 16 | Ht 62.0 in | Wt 251.6 lb

## 2020-01-10 DIAGNOSIS — I10 Essential (primary) hypertension: Secondary | ICD-10-CM

## 2020-01-10 DIAGNOSIS — M7989 Other specified soft tissue disorders: Secondary | ICD-10-CM | POA: Insufficient documentation

## 2020-01-10 DIAGNOSIS — M1A09X0 Idiopathic chronic gout, multiple sites, without tophus (tophi): Secondary | ICD-10-CM

## 2020-01-10 NOTE — Progress Notes (Signed)
Patient ID: Amber Weiss, female   DOB: [birth year], 59 y.o.   MRN: [id number]  Chief Complaint  Patient presents with  . New Patient (Initial Visit)    ref Angelica Chessman le edema    HPI Amber Weiss is a 59 y.o. female.  I am asked to see the patient by L. Angelica Chessman, PA-C for evaluation of leg swelling.  She has had this on and off in both legs for many years.  This comes and goes but over the past year or so has been worse than it has ever been.  She denies any trauma, injury, or inciting event that started the symptoms.  There is not a clear inciting event or causative factor.  Currently, the right leg is swelling a little bit more than the left but at times the left leg swells more than the right.  She does have significant gout and had attributed much of her symptoms due to the gout previously, but the swelling seems to be getting worse.  She is also having more pain and immobility in her feet.  She denies any history of DVT or superficial thrombophlebitis.  No fevers or chills.  No ulceration or infection.     Past Medical History:  Diagnosis Date  . Asthma   . Gout   . Hypertension   . OSA (obstructive sleep apnea)     Past Surgical History:  Procedure Laterality Date  . CESAREAN SECTION    . COLONOSCOPY  06/09/11     Family History  Problem Relation Age of Onset  . Cancer Mother        breast  . Hypertension Mother   . Emphysema Mother   . Cancer Father        skin  . Diabetes Father   . Cancer Sister        bladder  . Heart disease Neg Hx   . Stroke Neg Hx     Social History   Tobacco Use  . Smoking status: Former Smoker    Packs/day: 1.00    Years: 12.00    Pack years: 12.00    Types: Cigarettes    Quit date: 09/29/1992    Years since quitting: 27.2  . Smokeless tobacco: Never Used  Substance Use Topics  . Alcohol use: No  . Drug use: No     Allergies  Allergen Reactions  . Penicillins   . Sulfa Antibiotics Other (See Comments)    Unknown reaction      Current Outpatient Medications  Medication Sig Dispense Refill  . albuterol (PROVENTIL HFA;VENTOLIN HFA) 108 (90 BASE) MCG/ACT inhaler Inhale 2 puffs into the lungs every 6 (six) hours as needed for wheezing or shortness of breath.    . allopurinol (ZYLOPRIM) 100 MG tablet Take 1 tablet (100 mg total) by mouth daily. 30 tablet 6  . amLODipine (NORVASC) 10 MG tablet Take 1 tablet (10 mg total) by mouth daily. 90 tablet 1  . cholecalciferol (VITAMIN D) 1000 units tablet Take 1,000 Units by mouth daily.    . colchicine 0.6 MG tablet TAKE 2 TABS BY MOUTH AT START OF GOUT FLARE, THEN ONE MORE ONE HOUR LATER --  MAX of 3 pills per flare/episode 6 tablet 1  . metoprolol succinate (TOPROL-XL) 25 MG 24 hr tablet Take 0.5 tablets (12.5 mg total) by mouth daily. 90 tablet 1  . predniSONE (DELTASONE) 20 MG tablet 2 tabs poqday 1-3, 1 tabs poqday 4-6 9 tablet 0  .  Vitamin D, Ergocalciferol, (DRISDOL) 1.25 MG (50000 UNIT) CAPS capsule Take 1 capsule (50,000 Units total) by mouth every 7 (seven) days. x12 weeks. 12 capsule 0   No current facility-administered medications for this visit.      REVIEW OF SYSTEMS (Negative unless checked)  Constitutional: [] Weight loss  [] Fever  [] Chills Cardiac: [] Chest pain   [] Chest pressure   [] Palpitations   [] Shortness of breath when laying flat   [] Shortness of breath at rest   [] Shortness of breath with exertion. Vascular:  [] Pain in legs with walking   [] Pain in legs at rest   [] Pain in legs when laying flat   [] Claudication   [x] Pain in feet when walking  [x] Pain in feet at rest  [] Pain in feet when laying flat   [] History of DVT   [] Phlebitis   [x] Swelling in legs   [] Varicose veins   [] Non-healing ulcers Pulmonary:   [] Uses home oxygen   [] Productive cough   [] Hemoptysis   [] Wheeze  [] COPD   [x] Asthma Neurologic:  [] Dizziness  [] Blackouts   [] Seizures   [] History of stroke   [] History of TIA  [] Aphasia   [] Temporary blindness   [] Dysphagia   [] Weakness or  numbness in arms   [] Weakness or numbness in legs Musculoskeletal:  [x] Arthritis   [] Joint swelling   [x] Joint pain   [] Low back pain Hematologic:  [] Easy bruising  [] Easy bleeding   [] Hypercoagulable state   [] Anemic  [] Hepatitis Gastrointestinal:  [] Blood in stool   [] Vomiting blood  [x] Gastroesophageal reflux/heartburn   [] Abdominal pain Genitourinary:  [] Chronic kidney disease   [] Difficult urination  [] Frequent urination  [] Burning with urination   [] Hematuria Skin:  [] Rashes   [] Ulcers   [] Wounds Psychological:  [] History of anxiety   []  History of major depression.    Physical Exam BP (!) 160/90 (BP Location: Right Arm)   Pulse 73   Resp 16   Ht 5\' 2"  (1.575 m)   Wt 251 lb 9.6 oz (114.1 kg)   BMI 46.02 kg/m  Gen:  WD/WN, NAD Head: [place]/AT, No temporalis wasting. Ear/Nose/Throat: Hearing grossly intact, nares w/o erythema or drainage, oropharynx w/o Erythema/Exudate Eyes: Conjunctiva clear, sclera non-icteric  Neck: trachea midline.  No JVD.  Pulmonary:  Good air movement, respirations not labored, no use of accessory muscles  Cardiac: RRR, no JVD Vascular:  Vessel Right Left  Radial Palpable Palpable                          DP 2+ 2+  PT 1+ 1+   Gastrointestinal:. No masses, surgical incisions, or scars. Musculoskeletal: M/S 5/5 throughout.  Extremities without ischemic changes.  No deformity or atrophy. 1+ BLE edema. Neurologic: Sensation grossly intact in extremities.  Symmetrical.  Speech is fluent. Motor exam as listed above. Psychiatric: Judgment intact, Mood & affect appropriate for pt's clinical situation. Dermatologic: No rashes or ulcers noted.  No cellulitis or open wounds.    Radiology No results found.  Labs Recent Results (from the past 2160 hour(s))  Uric acid     Status: Abnormal   Collection Time: 12/20/19 11:58 AM  Result Value Ref Range   Uric Acid, Serum 8.7 (H) 2.5 - 7.0 mg/dL    Comment: Therapeutic target for gout patients: <6.0 mg/dL  .   COMPLETE METABOLIC PANEL WITH GFR     Status: None   Collection Time: 12/20/19 11:58 AM  Result Value Ref Range   Glucose, Bld 92 65 - 99 mg/dL  Comment: .            Fasting reference interval .    BUN 15 7 - 25 mg/dL   Creat 6.57 8.46 - 9.62 mg/dL    Comment: For patients >65 years of age, the reference limit for Creatinine is approximately 13% higher for people identified as African-American. .    GFR, Est Non African American 88 > OR = 60 mL/min/1.59m2   GFR, Est African American 102 > OR = 60 mL/min/1.36m2   BUN/Creatinine Ratio NOT APPLICABLE 6 - 22 (calc)   Sodium 145 135 - 146 mmol/L   Potassium 4.2 3.5 - 5.3 mmol/L   Chloride 108 98 - 110 mmol/L   CO2 31 20 - 32 mmol/L   Calcium 9.9 8.6 - 10.4 mg/dL   Total Protein 7.5 6.1 - 8.1 g/dL   Albumin 4.4 3.6 - 5.1 g/dL   Globulin 3.1 1.9 - 3.7 g/dL (calc)   AG Ratio 1.4 1.0 - 2.5 (calc)   Total Bilirubin 0.5 0.2 - 1.2 mg/dL   Alkaline phosphatase (APISO) 80 37 - 153 U/L   AST 16 10 - 35 U/L   ALT 21 6 - 29 U/L  VITAMIN D 25 Hydroxy (Vit-D Deficiency, Fractures)     Status: Abnormal   Collection Time: 12/20/19 11:58 AM  Result Value Ref Range   Vit D, 25-Hydroxy 14 (L) 30 - 100 ng/mL    Comment: Vitamin D Status         25-OH Vitamin D: . Deficiency:                    <20 ng/mL Insufficiency:             20 - 29 ng/mL Optimal:                 > or = 30 ng/mL . For 25-OH Vitamin D testing on patients on  D2-supplementation and patients for whom quantitation  of D2 and D3 fractions is required, the [facility](TM) 25-OH VIT D, (D2,D3), LC/MS/MS is recommended: order  code 95284 (patients >52yrs). See Note 1 . Note 1 . For additional information, please refer to  http://education.[facility].com/faq/FAQ199  (This link is being provided for informational/ educational purposes only.)   Brain natriuretic peptide     Status: None   Collection Time: 12/20/19 11:58 AM  Result Value Ref Range   Brain  Natriuretic Peptide 44 <100 pg/mL    Comment: . BNP levels increase with age in the general population with the highest values seen in individuals greater than 40 years of age. Reference: J. Am. Ladon Applebaum. Cardiol. 2002; 13:244-010. Marland Kitchen   CBC with Differential/Platelet     Status: None   Collection Time: 12/20/19 11:58 AM  Result Value Ref Range   WBC 6.4 3.8 - 10.8 Thousand/uL   RBC 4.24 3.80 - 5.10 Million/uL   Hemoglobin 12.1 11.7 - 15.5 g/dL   HCT 27.2 53.6 - 64.4 %   MCV 87.0 80.0 - 100.0 fL   MCH 28.5 27.0 - 33.0 pg   MCHC 32.8 32.0 - 36.0 g/dL   RDW 03.4 74.2 - 59.5 %   Platelets 166 140 - 400 Thousand/uL   MPV 12.2 7.5 - 12.5 fL   Neutro Abs 4,173 1,500 - 7,800 cells/uL   Lymphs Abs 1,382 850 - 3,900 cells/uL   Absolute Monocytes 422 200 - 950 cells/uL   Eosinophils Absolute 390 15 - 500 cells/uL   Basophils Absolute 32 0 -  200 cells/uL   Neutrophils Relative % 65.2 %   Total Lymphocyte 21.6 %   Monocytes Relative 6.6 %   Eosinophils Relative 6.1 %   Basophils Relative 0.5 %    Assessment/Plan:  Hypertension blood pressure control important in reducing the progression of atherosclerotic disease. On appropriate oral medications although the amlodipine could be contributing to the lower extremity swelling.   Chronic gout Can certainly be contributing to lower extremity swelling and pain.  Also, she has been on steroids which could create lower extremity swelling as well.  Swelling of limb I have had a long discussion with the patient regarding swelling and why it  causes symptoms.  Patient will begin wearing graduated compression stockings class 1 (20-30 mmHg) on a daily basis a prescription was given. The patient will  beginning wearing the stockings first thing in the morning and removing them in the evening. The patient is instructed specifically not to sleep in the stockings.   In addition, behavioral modification will be initiated.  This will include frequent  elevation, use of over the counter pain medications and exercise such as walking.  I have reviewed systemic causes for chronic edema such as liver, kidney and cardiac etiologies.  The patient denies problems with these organ systems.    Consideration for a lymph pump will also be made based upon the effectiveness of conservative therapy.  This would help to improve the edema control and prevent sequela such as ulcers and infections   Patient should undergo duplex ultrasound of the venous system to ensure that DVT or reflux is not present.  The patient will follow-up with me after the ultrasound.        Leotis Pain 01/10/2020, 10:15 AM   This note was created with Dragon medical transcription system.  Any errors from dictation are unintentional.

## 2020-01-10 NOTE — Assessment & Plan Note (Signed)
Can certainly be contributing to lower extremity swelling and pain.  Also, she has been on steroids which could create lower extremity swelling as well.

## 2020-01-10 NOTE — Assessment & Plan Note (Signed)
blood pressure control important in reducing the progression of atherosclerotic disease. On appropriate oral medications although the amlodipine could be contributing to the lower extremity swelling.

## 2020-01-10 NOTE — Assessment & Plan Note (Signed)

## 2020-01-10 NOTE — Patient Instructions (Signed)

## 2020-01-24 ENCOUNTER — Encounter: Payer: 59 | Admitting: Podiatry

## 2020-01-24 ENCOUNTER — Ambulatory Visit: Payer: 59

## 2020-03-08 ENCOUNTER — Other Ambulatory Visit: Payer: Self-pay | Admitting: Family Medicine

## 2020-03-08 NOTE — Progress Notes (Signed)
This encounter was created in error - please disregard.

## 2020-04-24 ENCOUNTER — Ambulatory Visit: Payer: Self-pay | Admitting: Family Medicine

## 2020-05-08 ENCOUNTER — Encounter: Payer: Self-pay | Admitting: Family Medicine

## 2020-05-08 ENCOUNTER — Other Ambulatory Visit: Payer: Self-pay

## 2020-05-08 ENCOUNTER — Ambulatory Visit: Payer: 59 | Admitting: Family Medicine

## 2020-05-08 VITALS — BP 130/80 | HR 84 | Temp 97.1°F | Resp 16 | Ht 62.0 in | Wt 237.3 lb

## 2020-05-08 DIAGNOSIS — Z1231 Encounter for screening mammogram for malignant neoplasm of breast: Secondary | ICD-10-CM

## 2020-05-08 DIAGNOSIS — M1A09X0 Idiopathic chronic gout, multiple sites, without tophus (tophi): Secondary | ICD-10-CM

## 2020-05-08 DIAGNOSIS — E559 Vitamin D deficiency, unspecified: Secondary | ICD-10-CM

## 2020-05-08 DIAGNOSIS — J4520 Mild intermittent asthma, uncomplicated: Secondary | ICD-10-CM

## 2020-05-08 DIAGNOSIS — R600 Localized edema: Secondary | ICD-10-CM

## 2020-05-08 DIAGNOSIS — M79672 Pain in left foot: Secondary | ICD-10-CM

## 2020-05-08 DIAGNOSIS — I10 Essential (primary) hypertension: Secondary | ICD-10-CM | POA: Diagnosis not present

## 2020-05-08 DIAGNOSIS — Z5181 Encounter for therapeutic drug level monitoring: Secondary | ICD-10-CM

## 2020-05-08 MED ORDER — METOPROLOL SUCCINATE ER 25 MG PO TB24: 12.5000 mg | ORAL_TABLET | Freq: Every day | ORAL | 3 refills | Status: DC

## 2020-05-08 NOTE — Progress Notes (Signed)
Name: Amber Weiss   MRN: [id number]    DOB: 03-29-1961   Date:05/08/2020       Progress Note  Chief Complaint  Patient presents with  . Follow-up  . Vitamin D deficency    f/u on Vitamin D  . Gout  . Medication Refill     Subjective:   Amber Weiss is a 59 y.o. female, presents to clinic for gout, vit D deficiency, LE edema, HTN  Hypertension:  Currently managed on metoprolol and amlodipine Pt reports good med compliance and denies any SE.  No lightheadedness, hypotension, syncope. Blood pressure today is well controlled. BP Readings from Last 3 Encounters:  05/08/20 130/80  01/10/20 (!) 160/90  12/20/19 (!) 148/88   Pt denies CP, SOB, exertional sx, palpitation, Ha's, visual disturbances Still considering switching BP meds, had past cough rxn to meds and gout flare with meds - doesn't remember which ones   LE edema has improved, she saw vascular- they noted they could not do any procedures until she wore compression stockings first, she has not yet followed up, she started walking, lost some weight, LE edema a little better.  Gout and foot pain:  Improved with starting allopurinol, she had shrimp w/o flare - recheck labs today Lab Results  Component Value Date   LABURIC 8.7 (H) 12/20/2019  started allopurinol - due for f/up labs Saw podiatry?  Asthma:   Continues to be well controlled with albuterol inhaler PRN  Vit D deficiency- completed Rx supplement but did not start OTC Last vitamin D Lab Results  Component Value Date   VD25OH 14 (L) 12/20/2019      Current Outpatient Medications:  .  albuterol (PROVENTIL HFA;VENTOLIN HFA) 108 (90 BASE) MCG/ACT inhaler, Inhale 2 puffs into the lungs every 6 (six) hours as needed for wheezing or shortness of breath. , Disp: , Rfl:  .  allopurinol (ZYLOPRIM) 100 MG tablet, Take 1 tablet (100 mg total) by mouth daily., Disp: 30 tablet, Rfl: 6 .  amLODipine (NORVASC) 10 MG tablet, Take 1 tablet (10 mg total) by mouth  daily., Disp: 90 tablet, Rfl: 1 .  metoprolol succinate (TOPROL-XL) 25 MG 24 hr tablet, Take 0.5 tablets (12.5 mg total) by mouth daily., Disp: 90 tablet, Rfl: 1 .  predniSONE (DELTASONE) 20 MG tablet, 2 tabs poqday 1-3, 1 tabs poqday 4-6 (Patient not taking: Reported on 05/08/2020), Disp: 9 tablet, Rfl: 0  Patient Active Problem List   Diagnosis Date Noted  . Swelling of limb 01/10/2020  . Anemia 06/25/2018  . Fatty liver 05/31/2018  . Splenomegaly 05/31/2018  . Kidney lesion 05/02/2017  . Cardiomegaly 05/02/2017  . Low HDL (under 40) 06/10/2016  . Vitamin D deficiency 12/13/2015  . Vitamin B12 deficiency 12/13/2015  . Morbid obesity (HCC) 09/30/2015  . Chronic gout 09/30/2015  . Hypertension   . Asthma   . OSA (obstructive sleep apnea)     Past Surgical History:  Procedure Laterality Date  . CESAREAN SECTION    . COLONOSCOPY  06/09/11    Family History  Problem Relation Age of Onset  . Cancer Mother        breast  . Hypertension Mother   . Emphysema Mother   . Cancer Father        skin  . Diabetes Father   . Cancer Sister        bladder  . Heart disease Neg Hx   . Stroke Neg Hx     Social History  Tobacco Use  . Smoking status: Former Smoker    Packs/day: 1.00    Years: 12.00    Pack years: 12.00    Types: Cigarettes    Quit date: 09/29/1992    Years since quitting: 27.6  . Smokeless tobacco: Never Used  Vaping Use  . Vaping Use: Never used  Substance Use Topics  . Alcohol use: No  . Drug use: No     Allergies  Allergen Reactions  . Penicillins   . Sulfa Antibiotics Other (See Comments)    Unknown reaction     Health Maintenance  Topic Date Due  . Hepatitis C Screening  Never done  . COVID-19 Vaccine (1) Never done  . HIV Screening  Never done  . MAMMOGRAM  03/10/2012  . TETANUS/TDAP  04/03/2017  . INFLUENZA VACCINE  06/14/2020  . COLONOSCOPY  06/08/2021  . PAP SMEAR-Modifier  07/30/2021    Chart Review Today: I personally reviewed  active problem list, medication list, allergies, family history, social history, health maintenance, notes from last encounter, lab results, imaging with the patient/caregiver today.   Review of Systems  10 Systems reviewed and are negative for acute change except as noted in the HPI.  Objective:   Vitals:   05/08/20 1513  BP: 130/80  Pulse: 84  Resp: 16  Temp: (!) 97.1 F (36.2 C)  TempSrc: Temporal  SpO2: 94%  Weight: 237 lb 4.8 oz (107.6 kg)  Height: 5\' 2"  (1.575 m)    Body mass index is 43.4 kg/m.  Physical Exam Vitals and nursing note reviewed.  Constitutional:      General: She is not in acute distress.    Appearance: Normal appearance. She is well-developed. She is obese. She is not ill-appearing, toxic-appearing or diaphoretic.  HENT:     Head: Normocephalic and atraumatic.     Nose: Nose normal.  Eyes:     General:        Right eye: No discharge.        Left eye: No discharge.     Conjunctiva/sclera: Conjunctivae normal.  Neck:     Trachea: No tracheal deviation.  Cardiovascular:     Rate and Rhythm: Normal rate and regular rhythm.     Pulses: Normal pulses.          Radial pulses are 2+ on the right side and 2+ on the left side.     Heart sounds: Normal heart sounds. No murmur heard.  No friction rub. No gallop.      Comments: Non-pitting BL LE edema  Pulmonary:     Effort: Pulmonary effort is normal. No respiratory distress.     Breath sounds: Normal breath sounds. No stridor. No wheezing, rhonchi or rales.  Musculoskeletal:     Right lower leg: Edema present.     Left lower leg: Edema present.  Skin:    General: Skin is warm and dry.     Coloration: Skin is not jaundiced or pale.     Findings: No rash.  Neurological:     Mental Status: She is alert.     Motor: No abnormal muscle tone.     Coordination: Coordination normal.  Psychiatric:        Behavior: Behavior normal.         Assessment & Plan:   1. Essential hypertension Well  controlled today, BP at goal, LE edema has been improving, she has some drug allergies but not sure of past meds tried that caused cough and  gout flare - may continue same meds for now, will f/up with pt with lab results if she really wants to pursue changing meds to see if LE edema would improve with d/c of norvasc? - metoprolol succinate (TOPROL-XL) 25 MG 24 hr tablet; Take 0.5 tablets (12.5 mg total) by mouth daily.  Dispense: 90 tablet; Refill: 3 - COMPLETE METABOLIC PANEL WITH GFR  2. Idiopathic chronic gout of multiple sites without tophus Improving sx with starting allopurinol, recheck labs and titrate to Uric acid <6.0 - COMPLETE METABOLIC PANEL WITH GFR - Uric acid  3. Medication monitoring encounter - COMPLETE METABOLIC PANEL WITH GFR - Uric acid - VITAMIN D 25 Hydroxy (Vit-D Deficiency, Fractures)  4. Vitamin D deficiency Completed Rx supplement and did not start daily - instructed to take 2000 IU otc daily  - COMPLETE METABOLIC PANEL WITH GFR - VITAMIN D 25 Hydroxy (Vit-D Deficiency, Fractures)  5. Left foot pain improvine  6. Moderate persistent asthma without complication Changed dx - pt currently has mild intermittent asthma, doing well on albuterol inhaler prn only No recent exacerbation, no nighttime waking, very rare limitations by asthma SOB with exertion improved with working on activity level  7. Bilateral lower extremity edema Improving, saw vascular - encouraged her to follow recommended tx  8. Encounter for screening mammogram for malignant neoplasm of breast Due for mammogram and overdue for CPE - will order mammo - encouraged her to call and schedule and get her physical scheduled - MM 3D SCREEN BREAST BILATERAL; Future   Return in about 6 months (around 11/07/2020) for Routine follow-up/CPE split visit 40 min .   Danelle Berry, PA-C 05/08/20 3:18 PM

## 2020-05-08 NOTE — Patient Instructions (Addendum)
WE will call you with your lab results and let you know about possible med changes in the next week   Vit D level at least 1000 to 2000 IU daily   Preventing Osteoporosis, Adult Osteoporosis is a condition that causes the bones to lose density. This means that the bones become thinner, and the normal spaces in bone tissue become larger. Low bone density can make the bones weak and cause them to break more easily. Osteoporosis cannot always be prevented, but you can take steps to lower your risk of developing this condition. How can this condition affect me? If you develop osteoporosis, you will be more likely to break bones in your wrist, spine, or hip. Even a minor accident or injury can be enough to break weak bones. The bones will also be slower to heal. Osteoporosis can cause other problems as well, such as a stooped posture or trouble with movement. Osteoporosis can occur with aging. As you get older, you may lose bone tissue more quickly, or it may be replaced more slowly. Osteoporosis is more likely to develop if you have poor nutrition or do not get enough calcium or vitamin D. Other lifestyle factors can also play a role. By eating a well-balanced diet and making lifestyle changes, you can help keep your bones strong and healthy, lowering your chances of developing osteoporosis. What can increase my risk? The following factors may make you more likely to develop osteoporosis:  Having a family history of the condition.  Having poor nutrition or not getting enough calcium or vitamin D.  Using certain medicines, such as steroid medicines or antiseizure medicines.  Being any of the following: ? 23 years of age or older. ? Female. ? A woman who has gone through menopause (is postmenopausal). ? White (Caucasian) or of Asian descent.  Smoking or having a history of smoking.  Not being physically active (being sedentary).  Having a small body frame. What actions can I take to prevent  this?  Get enough calcium   Make sure you get enough calcium every day. Calcium is the most important mineral for bone health. Most people can get enough calcium from their diet, but supplements may be recommended for people who are at risk for osteoporosis. Follow these guidelines: ? If you are age 85 or younger, aim to get 1,000 mg of calcium every day. ? If you are older than age 64, aim to get 1,200 mg of calcium every day.  Good sources of calcium include: ? Dairy products, such as low-fat or nonfat milk, cheese, and yogurt. ? Dark green leafy vegetables, such as bok choy and broccoli. ? Foods that have had calcium added to them (calcium-fortified foods), such as orange juice, cereal, bread, soy beverages, and tofu products. ? Nuts, such as almonds.  Check nutrition labels to see how much calcium is in a food or drink. Get enough vitamin D  Try to get enough vitamin D every day. Vitamin D is the most essential vitamin for bone health. It helps the body absorb calcium. Follow these guidelines for how much vitamin D to get from food: ? If you are age 80 or younger, aim to get at least 600 international units (IU) every day. Your health care provider may suggest more. ? If you are older than age 72, aim to get at least 800 international units every day. Your health care provider may suggest more.  Good sources of vitamin D in your diet include: ? Egg yolks. ?  Oily fish, such as salmon, sardines, and tuna. ? Milk and cereal fortified with vitamin D.  Your body also makes vitamin D when you are out in the sun. Exposing the bare skin on your face, arms, legs, or back to the sun for no more than 30 minutes a day, 2 times a week is more than enough. Beyond that, make sure you use sunblock to protect your skin from sunburn, which increases your risk for skin cancer. Exercise  Stay active and get exercise every day.  Ask your health care provider what types of exercise are best for you.  Weight-bearing and strength-building activities are important for building and maintaining healthy bones. Some examples of these types of activities include: ? Walking and hiking. ? Jogging and running. ? Dancing. ? Gym exercises. ? Lifting weights. ? Tennis and racquetball. ? Climbing stairs. ? Aerobics. Make other lifestyle changes  Do not use any products that contain nicotine or tobacco, such as cigarettes, e-cigarettes, and chewing tobacco. If you need help quitting, ask your health care provider.  Lose weight if you are overweight.  If you drink alcohol: ? Limit how much you use to:  0-1 drink a day for nonpregnant women.  0-2 drinks a day for men. ? Be aware of how much alcohol is in your drink. In the [place] one drink equals one 12 oz bottle of beer (355 mL), one 5 oz glass of wine (148 mL), or one 1 oz glass of hard liquor (44 mL). Where to find support If you need help making changes to prevent osteoporosis, talk with your health care provider. You can ask for a referral to a diet and nutrition specialist (dietitian) and a physical therapist. Where to find more information Learn more about osteoporosis from:  NIH Osteoporosis and Related Bone Diseases National Resource Center: www.bones.http://www.myers.net/  [place] Office on [employer] Health: http://hoffman.com/  National Osteoporosis Foundation: [employer] Summary  Osteoporosis is a condition that causes weak bones that are more likely to break.  Eat a healthy diet, making sure you get enough calcium and vitamin D, and stay active by getting regular exercise to help prevent osteoporosis.  Other ways to reduce your risk of osteoporosis include maintaining a healthy weight and avoiding alcohol and products that contain nicotine or tobacco. This information is not intended to replace advice given to you by your health care provider. Make sure you discuss any questions you have with your health care provider. Document Revised:  05/31/2019 Document Reviewed: 05/31/2019 Elsevier Patient Education  2020 [employer].

## 2020-05-09 LAB — COMPLETE METABOLIC PANEL WITH GFR
AG Ratio: 1.4 (calc) (ref 1.0–2.5)
ALT: 17 U/L (ref 6–29)
AST: 16 U/L (ref 10–35)
Albumin: 4.4 g/dL (ref 3.6–5.1)
Alkaline phosphatase (APISO): 94 U/L (ref 37–153)
BUN: 19 mg/dL (ref 7–25)
CO2: 25 mmol/L (ref 20–32)
Calcium: 9.4 mg/dL (ref 8.6–10.4)
Chloride: 106 mmol/L (ref 98–110)
Creat: 0.85 mg/dL (ref 0.50–1.05)
GFR, Est African American: 87 mL/min/{1.73_m2} (ref >=60)
GFR, Est Non African American: 75 mL/min/{1.73_m2} (ref >=60)
Globulin: 3.1 g/dL (calc) (ref 1.9–3.7)
Glucose, Bld: 138 mg/dL — ABNORMAL HIGH (ref 65–99)
Potassium: 3.9 mmol/L (ref 3.5–5.3)
Sodium: 142 mmol/L (ref 135–146)
Total Bilirubin: 0.6 mg/dL (ref 0.2–1.2)
Total Protein: 7.5 g/dL (ref 6.1–8.1)

## 2020-05-09 LAB — URIC ACID: Uric Acid, Serum: 7.5 mg/dL — ABNORMAL HIGH (ref 2.5–7.0)

## 2020-05-09 LAB — VITAMIN D 25 HYDROXY (VIT D DEFICIENCY, FRACTURES): Vit D, 25-Hydroxy: 28 ng/mL — ABNORMAL LOW (ref 30–100)

## 2020-05-11 ENCOUNTER — Other Ambulatory Visit: Payer: Self-pay | Admitting: Family Medicine

## 2020-05-11 MED ORDER — ALLOPURINOL 100 MG PO TABS
200.0000 mg | ORAL_TABLET | Freq: Every day | ORAL | 11 refills | Status: DC
Start: 2020-05-11 — End: 2021-06-07

## 2020-05-12 ENCOUNTER — Telehealth: Payer: Self-pay

## 2020-05-12 DIAGNOSIS — R7301 Impaired fasting glucose: Secondary | ICD-10-CM

## 2020-05-12 NOTE — Telephone Encounter (Signed)
-----   Message from Danelle Berry, [place] sent at 05/12/2020 11:20 AM EDT ----- The losartan on the med list only says "side effect" I read through all the notes associated with those visits around that time and nothing else is mentioned and there are no other allergies on her chart, no other meds on her chart.  If she has any o ther medical records we can request them to review those - but as of now - I cannot find both meds she mentions -   Lisinopril commonly causes dry cough as a side effect  HCTZ can exacerbate gout  I would avoid those - but generally if a pt has cough with lisinopril, they can be swtiched to losartan - so I'm happy to try that.  I do not think we can add on A1C.  If she was fasting for labs then pt can come back and do A1C for Dx of impaired fasting glucose.

## 2020-05-12 NOTE — Telephone Encounter (Signed)
  MyChart Results Release    Garrison [place], RN  05/12/2020 9:58 AM EDT     Pt given lab results per notes of Henry Russel PA-C on 05/11/20. Pt needed clarification on the following: She is also not sure why the Losartan was discontinued. Pt stated that she was given and BP med with a diuretic and is exacerbated her gout. Also was put on a different one which was dc'd after cough. Pt concerned about her glucose level and asked if further testing is needed.  Pt asked if she needs to take the RX Vitamin D or an OTC supplement. Routing back to office to review and advise.   Smitty Pluck [place], RN  05/12/2020 9:39 AM EDT         Left detailed vm   Danelle Berry, PA-C  05/11/2020 12:30 PM EDT     Please call patient with her lab results:  Uric acid is still high-increase her allopurinol to 200 mg once daily 7.5, goal is to be less than 6, it has improved it used to be 10.1  Vitamin D is improving is almost at normal level, recommend continuing to supplement with vitamin D to maintain  Her kidney function and electrolytes and liver function tests were also good  Did she want to change BP meds?? (can you please ask her again for me?)  she told me she had a problem with 1 or 2 other blood pressure medications and in reviewing her chart thoroughly all I can find is that she took losartan once for about a month or so (05/2018 - 07/2018) and then stopped taking it and there is nothing written in the office visit notes medicine notes or allergies about why the medication was stopped      Order Questions      VITAMIN D 25 Hydroxy (Vit-D Deficiency, Fractures): Patient Communication  Add Comments Seen  All Reviewers List  Marcos Eke, CMA on 05/11/2020 15:53  Danelle Berry, PA-C on 05/11/2020 12:30  Encounter  View Encounter       Result Information  Flag: AbnormalAbnormal  Status: Final result (Collected: 05/08/2020 15:41) Provider Status: Reviewed    Lab  Information  QUEST    Order-Level Documents:  There are no order-level documents. View SmartLink Info  VITAMIN D 25 Hydroxy (Vit-D Deficiency, Fractures) (Order #[id number]) on 05/08/20  Result Read / Acknowledged  User Time Read / Dellie Burns, PA-C 05/11/2020 12:30 PM  Marcos Eke, CMA 05/11/2020 3:53 PM  VITAMIN D 25 Hydroxy (Vit-D Deficiency, Fractures): Patient Communication  Add Comments Seen

## 2020-05-12 NOTE — Telephone Encounter (Signed)
Pt.notified

## 2020-05-19 ENCOUNTER — Other Ambulatory Visit: Payer: Self-pay | Admitting: Family Medicine

## 2020-05-19 DIAGNOSIS — I10 Essential (primary) hypertension: Secondary | ICD-10-CM

## 2020-06-18 ENCOUNTER — Other Ambulatory Visit: Payer: Self-pay | Admitting: Family Medicine

## 2020-09-04 ENCOUNTER — Encounter: Payer: 59 | Admitting: Family Medicine

## 2020-11-20 ENCOUNTER — Ambulatory Visit: Payer: 59 | Admitting: Family Medicine

## 2020-12-08 ENCOUNTER — Ambulatory Visit: Payer: Self-pay | Admitting: *Deleted

## 2020-12-08 ENCOUNTER — Ambulatory Visit: Payer: 59 | Admitting: Family Medicine

## 2020-12-08 NOTE — Telephone Encounter (Signed)
Pt calling stating that she tested positive for covid on 12/07/20. She states that she has questions regarding her oxygen levels and when she should be concerned. Please advise.  Patient advised she can monitor her oxygen level- if her number goes below 90- she needs to go to ED. Patient advised to call office if she feels she is not improving.  Reason for Disposition . HIGH RISK for severe COVID complications (e.g., age > 64 years, obesity with BMI > 25, pregnant, chronic lung disease or other chronic medical condition)  (Exception: Already seen by PCP and no new or worsening symptoms.)  Answer Assessment - Initial Assessment Questions 1. COVID-19 DIAGNOSIS: "Who made your COVID-19 diagnosis?" "Was it confirmed by a positive lab test?" If not diagnosed by a HCP, ask "Are there lots of cases (community spread) where you live?" Note: See public health department website, if unsure.     12/07/20 2. COVID-19 EXPOSURE: "Was there any known exposure to COVID before the symptoms began?" CDC Definition of close contact: within 6 feet (2 meters) for a total of 15 minutes or more over a 24-hour period.      No known 3. ONSET: "When did the COVID-19 symptoms start?"      Patient was screening 4. WORST SYMPTOM: "What is your worst symptom?" (e.g., cough, fever, shortness of breath, muscle aches)     Cough 5. COUGH: "Do you have a cough?" If Yes, ask: "How bad is the cough?"       Cough- better during the day- sore from coughing 6. FEVER: "Do you have a fever?" If Yes, ask: "What is your temperature, how was it measured, and when did it start?"     Yes- 101-tylenol is helping 7. RESPIRATORY STATUS: "Describe your breathing?" (e.g., shortness of breath, wheezing, unable to speak)      Yes- patient has asthma, mild- 92 8. BETTER-SAME-WORSE: "Are you getting better, staying the same or getting worse compared to yesterday?"  If getting worse, ask, "In what way?"     same 9. HIGH RISK DISEASE: "Do you have any  chronic medical problems?" (e.g., asthma, heart or lung disease, weak immune system, obesity, etc.)     asthma 10. VACCINE: "Have you gotten the COVID-19 vaccine?" If Yes ask: "Which one, how many shots, when did you get it?"       no 11. PREGNANCY: "Is there any chance you are pregnant?" "When was your last menstrual period?"       n/a 12. OTHER SYMPTOMS: "Do you have any other symptoms?"  (e.g., chills, fatigue, headache, loss of smell or taste, muscle pain, sore throat; new loss of smell or taste especially support the diagnosis of COVID-19)       Fatigue, muscle pain, congestion  Protocols used: CORONAVIRUS (COVID-19) DIAGNOSED OR SUSPECTED-A-AH

## 2020-12-09 NOTE — Telephone Encounter (Signed)
FYI

## 2020-12-31 ENCOUNTER — Ambulatory Visit: Payer: 59 | Admitting: Family Medicine

## 2021-02-12 ENCOUNTER — Ambulatory Visit (INDEPENDENT_AMBULATORY_CARE_PROVIDER_SITE_OTHER): Payer: 59 | Admitting: Nurse Practitioner

## 2021-02-12 ENCOUNTER — Encounter (INDEPENDENT_AMBULATORY_CARE_PROVIDER_SITE_OTHER): Payer: 59

## 2021-04-02 ENCOUNTER — Other Ambulatory Visit: Payer: Self-pay | Admitting: Internal Medicine

## 2021-04-02 DIAGNOSIS — Z1231 Encounter for screening mammogram for malignant neoplasm of breast: Secondary | ICD-10-CM

## 2021-04-16 ENCOUNTER — Other Ambulatory Visit: Payer: Self-pay

## 2021-04-16 ENCOUNTER — Ambulatory Visit
Admission: RE | Admit: 2021-04-16 | Discharge: 2021-04-16 | Disposition: A | Discharge status: Home / Self Care | Payer: 59 | Source: Ambulatory Visit | Attending: Internal Medicine | Admitting: Internal Medicine

## 2021-04-16 DIAGNOSIS — Z1231 Encounter for screening mammogram for malignant neoplasm of breast: Secondary | ICD-10-CM | POA: Diagnosis present

## 2021-04-19 ENCOUNTER — Other Ambulatory Visit: Payer: Self-pay | Admitting: *Deleted

## 2021-04-19 ENCOUNTER — Inpatient Hospital Stay
Admission: RE | Admit: 2021-04-19 | Discharge: 2021-04-19 | Disposition: A | Discharge status: Home / Self Care | Payer: Self-pay | Source: Ambulatory Visit | Attending: *Deleted | Admitting: *Deleted

## 2021-04-19 DIAGNOSIS — Z1231 Encounter for screening mammogram for malignant neoplasm of breast: Secondary | ICD-10-CM

## 2021-06-06 ENCOUNTER — Other Ambulatory Visit: Payer: Self-pay | Admitting: Family Medicine

## 2021-06-06 DIAGNOSIS — I10 Essential (primary) hypertension: Secondary | ICD-10-CM

## 2021-06-07 NOTE — Telephone Encounter (Signed)
Given 30 day supply needs appt for further refills been a yr since seen

## 2021-06-12 ENCOUNTER — Ambulatory Visit
Admission: EM | Admit: 2021-06-12 | Discharge: 2021-06-12 | Disposition: A | Discharge status: Home / Self Care | Payer: 59 | Attending: Family Medicine | Admitting: Family Medicine

## 2021-06-12 ENCOUNTER — Other Ambulatory Visit: Payer: Self-pay

## 2021-06-12 DIAGNOSIS — H1031 Unspecified acute conjunctivitis, right eye: Secondary | ICD-10-CM

## 2021-06-12 MED ORDER — MOXIFLOXACIN HCL 0.5 % OP SOLN: 1.0000 [drp] | Freq: Three times a day (TID) | OPHTHALMIC | 0 refills | Status: AC

## 2021-06-12 NOTE — ED Provider Notes (Signed)
[facility]    CSN: [id number] Arrival date & time: 06/12/21  1117      History   Chief Complaint Chief Complaint  Patient presents with   Conjunctivitis    HPI 60 year old female presents with the above complaint.  Patient reports that her symptoms started last night.  She reports right eye redness and drainage.  Associated mild discomfort and some photophobia.  No relieving factors.  Pain currently 2/10 in severity.  Patient believes that she has pinkeye.  No other complaints or concerns at this time.  Past Medical History:  Diagnosis Date   Asthma    Gout    Hypertension    OSA (obstructive sleep apnea)    Patient Active Problem List   Diagnosis Date Noted   Swelling of limb 01/10/2020   Anemia 06/25/2018   Fatty liver 05/31/2018   Splenomegaly 05/31/2018   Kidney lesion 05/02/2017   Cardiomegaly 05/02/2017   Low HDL (under 40) 06/10/2016   Vitamin D deficiency 12/13/2015   Vitamin B12 deficiency 12/13/2015   Morbid obesity (HCC) 09/30/2015   Chronic gout 09/30/2015   Hypertension    Asthma    OSA (obstructive sleep apnea)    Past Surgical History:  Procedure Laterality Date   CESAREAN SECTION     COLONOSCOPY  06/09/11   OB History   No obstetric history on file.    Home Medications    Prior to Admission medications   Medication Sig Start Date End Date Taking? Authorizing Provider  albuterol (PROVENTIL HFA;VENTOLIN HFA) 108 (90 BASE) MCG/ACT inhaler Inhale 2 puffs into the lungs every 6 (six) hours as needed for wheezing or shortness of breath.    Yes [provider]  allopurinol (ZYLOPRIM) 100 MG tablet TAKE 2 TABLETS BY MOUTH EVERY DAY 06/07/21  Yes Danelle Berry, PA-C  amLODipine (NORVASC) 10 MG tablet TAKE 1 TABLET BY MOUTH EVERY DAY 06/07/21  Yes Danelle Berry, PA-C  moxifloxacin (VIGAMOX) 0.5 % ophthalmic solution Place 1 drop into the right eye 3 (three) times daily for 7 days. 06/12/21 06/19/21 Yes Tommie Sams, DO    Family  History Family History  Problem Relation Age of Onset   Cancer Mother        breast   Hypertension Mother    Emphysema Mother    Breast cancer Mother 90   Cancer Father        skin   Diabetes Father    Cancer Sister        bladder   Heart disease Neg Hx    Stroke Neg Hx     Social History Social History   Tobacco Use   Smoking status: Former    Packs/day: 1.00    Years: 12.00    Pack years: 12.00    Types: Cigarettes    Quit date: 09/29/1992    Years since quitting: 28.7   Smokeless tobacco: Never  Vaping Use   Vaping Use: Never used  Substance Use Topics   Alcohol use: No   Drug use: No     Allergies   Penicillins and Sulfa antibiotics   Review of Systems Review of Systems  Constitutional: Negative.   Eyes:  Positive for photophobia, discharge and redness.    Physical Exam Triage Vital Signs ED Triage Vitals  Enc Vitals Group     BP 06/12/21 1129 (!) 143/80     Pulse Rate 06/12/21 1129 60     Resp 06/12/21 1129 18     Temp  06/12/21 1129 97.8 F (36.6 C)     Temp Source 06/12/21 1129 Oral     SpO2 06/12/21 1129 98 %     Weight 06/12/21 1126 202 lb 13.2 oz (92 kg)     Height 06/12/21 1126 5\' 2"  (1.575 m)     Head Circumference --      Peak Flow --      Pain Score 06/12/21 1126 2     Pain Loc --      Pain Edu? --      Excl. in GC? --    Updated Vital Signs BP (!) 143/80 (BP Location: Right Arm)   Pulse 60   Temp 97.8 F (36.6 C) (Oral)   Resp 18   Ht 5\' 2"  (1.575 m)   Wt 92 kg   SpO2 98%   BMI 37.10 kg/m   Visual Acuity Right Eye Distance: 20/50 (uncorrected) Left Eye Distance: 20/25 Bilateral Distance:    Right Eye Near:   Left Eye Near:    Bilateral Near:     Physical Exam Vitals and nursing note reviewed.  Constitutional:      General: She is not in acute distress.    Appearance: Normal appearance. She is obese. She is not ill-appearing.  HENT:     Head: Normocephalic and atraumatic.  Eyes:     Comments: Right eye with  diffuse redness.  Watering noted.  Pulmonary:     Effort: Pulmonary effort is normal. No respiratory distress.  Neurological:     Mental Status: She is alert.  Psychiatric:        Mood and Affect: Mood normal.        Behavior: Behavior normal.     UC Treatments / Results  Labs (all labs ordered are listed, but only abnormal results are displayed) Labs Reviewed - No data to display  EKG   Radiology No results found.  Procedures Procedures (including critical care time)  Medications Ordered in UC Medications - No data to display  Initial Impression / Assessment and Plan / UC Course  I have reviewed the triage vital signs and the nursing notes.  Pertinent labs & imaging results that were available during my care of the patient were reviewed by me and considered in my medical decision making (see chart for details).    60 year old female presents with conjunctivitis.  Treating with Vigamox.  Final Clinical Impressions(s) / UC Diagnoses   Final diagnoses:  Acute bacterial conjunctivitis of right eye   Discharge Instructions   None    ED Prescriptions     Medication Sig Dispense Auth. Provider   moxifloxacin (VIGAMOX) 0.5 % ophthalmic solution Place 1 drop into the right eye 3 (three) times daily for 7 days. 3 mL , DO      PDMP not reviewed this encounter.   67, DO 06/12/21 1249

## 2021-06-12 NOTE — ED Triage Notes (Signed)
Patient states that she has been having eye redness and drainage from right eye since last night, very sensitive to light.

## 2021-06-19 ENCOUNTER — Other Ambulatory Visit: Payer: Self-pay | Admitting: Family Medicine

## 2021-06-28 ENCOUNTER — Other Ambulatory Visit: Payer: Self-pay | Admitting: Family Medicine

## 2021-07-04 ENCOUNTER — Other Ambulatory Visit: Payer: Self-pay | Admitting: Family Medicine

## 2021-07-04 DIAGNOSIS — I10 Essential (primary) hypertension: Secondary | ICD-10-CM

## 2021-08-02 ENCOUNTER — Telehealth: Payer: Self-pay | Admitting: Family Medicine

## 2021-08-02 NOTE — Telephone Encounter (Signed)
Spoke with pt and she is no longer a pt here. Please disreguard refill request

## 2021-08-02 NOTE — Telephone Encounter (Signed)
No more refills until pt schedules an appt

## 2022-02-01 ENCOUNTER — Other Ambulatory Visit: Payer: Self-pay | Admitting: Family Medicine

## 2022-09-12 ENCOUNTER — Encounter (INDEPENDENT_AMBULATORY_CARE_PROVIDER_SITE_OTHER): Payer: Self-pay

## 2022-10-14 DIAGNOSIS — I10 Essential (primary) hypertension: Secondary | ICD-10-CM | POA: Diagnosis not present

## 2022-10-14 DIAGNOSIS — E782 Mixed hyperlipidemia: Secondary | ICD-10-CM | POA: Diagnosis not present

## 2022-10-14 DIAGNOSIS — M109 Gout, unspecified: Secondary | ICD-10-CM | POA: Diagnosis not present

## 2022-10-14 DIAGNOSIS — E668 Other obesity: Secondary | ICD-10-CM | POA: Diagnosis not present

## 2022-10-24 ENCOUNTER — Other Ambulatory Visit: Payer: Self-pay | Admitting: Internal Medicine

## 2022-10-24 DIAGNOSIS — M109 Gout, unspecified: Secondary | ICD-10-CM | POA: Diagnosis not present

## 2022-10-24 DIAGNOSIS — Z1231 Encounter for screening mammogram for malignant neoplasm of breast: Secondary | ICD-10-CM

## 2022-10-24 DIAGNOSIS — E782 Mixed hyperlipidemia: Secondary | ICD-10-CM | POA: Diagnosis not present

## 2022-10-24 DIAGNOSIS — I10 Essential (primary) hypertension: Secondary | ICD-10-CM | POA: Diagnosis not present

## 2023-03-23 ENCOUNTER — Inpatient Hospital Stay: Admission: RE | Admit: 2023-03-23 | Payer: Self-pay | Source: Ambulatory Visit

## 2023-05-20 ENCOUNTER — Other Ambulatory Visit: Payer: Self-pay | Admitting: Internal Medicine

## 2023-07-11 ENCOUNTER — Ambulatory Visit: Payer: 59 | Admitting: Internal Medicine

## 2023-07-13 DIAGNOSIS — B029 Zoster without complications: Secondary | ICD-10-CM | POA: Diagnosis not present

## 2023-07-18 ENCOUNTER — Other Ambulatory Visit: Payer: Self-pay | Admitting: Cardiology

## 2023-07-18 ENCOUNTER — Ambulatory Visit: Payer: 59 | Admitting: Cardiology

## 2023-09-20 ENCOUNTER — Other Ambulatory Visit: Payer: Self-pay | Admitting: Internal Medicine

## 2023-09-20 MED ORDER — ALBUTEROL SULFATE HFA 108 (90 BASE) MCG/ACT IN AERS: 2.0000 | INHALATION_SPRAY | Freq: Four times a day (QID) | RESPIRATORY_TRACT | 2 refills | Status: DC | PRN

## 2023-09-28 ENCOUNTER — Encounter: Payer: Self-pay | Admitting: Internal Medicine

## 2023-09-28 ENCOUNTER — Ambulatory Visit: Payer: 59 | Admitting: Internal Medicine

## 2023-09-28 VITALS — BP 130/80 | HR 54 | Ht 61.0 in | Wt 252.4 lb

## 2023-09-28 DIAGNOSIS — I10 Essential (primary) hypertension: Secondary | ICD-10-CM

## 2023-09-28 DIAGNOSIS — G4489 Other headache syndrome: Secondary | ICD-10-CM

## 2023-09-28 DIAGNOSIS — R001 Bradycardia, unspecified: Secondary | ICD-10-CM

## 2023-09-28 DIAGNOSIS — R002 Palpitations: Secondary | ICD-10-CM | POA: Diagnosis not present

## 2023-09-28 DIAGNOSIS — M1A29X0 Drug-induced chronic gout, multiple sites, without tophus (tophi): Secondary | ICD-10-CM

## 2023-09-28 DIAGNOSIS — E782 Mixed hyperlipidemia: Secondary | ICD-10-CM | POA: Diagnosis not present

## 2023-09-28 DIAGNOSIS — R0789 Other chest pain: Secondary | ICD-10-CM

## 2023-09-28 NOTE — Progress Notes (Signed)
Established Patient Office Visit  Subjective:  Patient ID: Amber Weiss, female    DOB: Jun 16, 1961  Age: 62 y.o. MRN: [id number]  Chief Complaint  Patient presents with   Follow-up    refills    Patient comes in for follow-up today.  She is not in since December 2023.  Reports that she was doing fine but has gained weight since then.  She is also missed her mammogram and DEXA scan. Today however reports of intermittent palpitations and chest pain.  Occasionally feels some chest tightness and shortness of breath.  She does not smoke and currently is not having any cough, fevers or chills. She also noted new onset right-sided sharp stabbing headaches which are transient, happened multiple times in a day and just started 1 week ago.  She does not have a history of migraines or cluster headaches.  She does feel mild tingling and numbness around her mouth area but does not have any difficulty with her vision or speech. Her EKG done today shows a normal sinus rhythm.  Will schedule a 2D echo. Check labs. Schedule CT scan of the head. May need to go to ED if symptoms get worse.    No other concerns at this time.   Past Medical History:  Diagnosis Date   Asthma    Gout    Hypertension    OSA (obstructive sleep apnea)     Past Surgical History:  Procedure Laterality Date   CESAREAN SECTION     COLONOSCOPY  06/09/11    Social History   Socioeconomic History   Marital status: Married    Spouse name: Not on file   Number of children: Not on file   Years of education: Not on file   Highest education level: Not on file  Occupational History   Not on file  Tobacco Use   Smoking status: Former    Current packs/day: 0.00    Average packs/day: 1 pack/day for 12.0 years (12.0 ttl pk-yrs)    Types: Cigarettes    Start date: 09/29/1980    Quit date: 09/29/1992    Years since quitting: 31.0   Smokeless tobacco: Never  Vaping Use   Vaping status: Never Used  Substance and Sexual  Activity   Alcohol use: No   Drug use: No   Sexual activity: Not on file  Other Topics Concern   Not on file  Social History Narrative   Not on file   Social Determinants of Health   Financial Resource Strain: Not on file  Food Insecurity: Not on file  Transportation Needs: Not on file  Physical Activity: Not on file  Stress: Not on file  Social Connections: Not on file  Intimate Partner Violence: Not on file    Family History  Problem Relation Age of Onset   Cancer Mother        breast   Hypertension Mother    Emphysema Mother    Breast cancer Mother 20   Cancer Father        skin   Diabetes Father    Cancer Sister        bladder   Heart disease Neg Hx    Stroke Neg Hx     Allergies  Allergen Reactions   Penicillins    Sulfa Antibiotics Other (See Comments)    Unknown reaction     Outpatient Medications Prior to Visit  Medication Sig   albuterol (VENTOLIN HFA) 108 (90 Base) MCG/ACT inhaler Inhale 2  puffs into the lungs every 6 (six) hours as needed for wheezing or shortness of breath.   allopurinol (ZYLOPRIM) 100 MG tablet TAKE 1 TABLET BY MOUTH EVERY DAY   amLODipine (NORVASC) 5 MG tablet TAKE 1 TABLET BY MOUTH EVERY DAY   colchicine 0.6 MG tablet Take 0.6 mg by mouth daily.   [DISCONTINUED] amphetamine-dextroamphetamine (ADDERALL) 5 MG tablet Take 1 tablet by mouth 2 (two) times daily.   No facility-administered medications prior to visit.    Review of Systems  Constitutional:  Positive for malaise/fatigue. Negative for chills, fever and weight loss.  HENT: Negative.  Negative for congestion, ear pain, hearing loss and sore throat.   Eyes: Negative.  Negative for blurred vision, double vision, photophobia and pain.  Respiratory:  Positive for shortness of breath. Negative for cough, wheezing and stridor.   Cardiovascular:  Positive for chest pain and palpitations. Negative for leg swelling.  Gastrointestinal: Negative.  Negative for abdominal pain,  constipation, diarrhea, heartburn, nausea and vomiting.  Genitourinary: Negative.  Negative for dysuria and flank pain.  Musculoskeletal: Negative.  Negative for joint pain and myalgias.  Skin: Negative.  Negative for itching and rash.  Neurological:  Positive for tingling and headaches. Negative for dizziness, speech change, focal weakness, loss of consciousness and weakness.  Endo/Heme/Allergies: Negative.   Psychiatric/Behavioral: Negative.  Negative for depression and suicidal ideas. The patient is not nervous/anxious.        Objective:   BP 130/80   Pulse (!) 54   Ht 5\' 1"  (1.549 m)   Wt 252 lb 6.4 oz (114.5 kg)   SpO2 95%   BMI 47.69 kg/m   Vitals:   09/28/23 1429  BP: 130/80  Pulse: (!) 54  Height: 5\' 1"  (1.549 m)  Weight: 252 lb 6.4 oz (114.5 kg)  SpO2: 95%  BMI (Calculated): 47.72    Physical Exam Vitals and nursing note reviewed.  Constitutional:      Appearance: Normal appearance.  HENT:     Head: Normocephalic and atraumatic.     Nose: Nose normal.     Mouth/Throat:     Mouth: Mucous membranes are moist.     Pharynx: Oropharynx is clear.  Eyes:     Conjunctiva/sclera: Conjunctivae normal.     Pupils: Pupils are equal, round, and reactive to light.  Cardiovascular:     Rate and Rhythm: Normal rate and regular rhythm.     Pulses: Normal pulses.     Heart sounds: Normal heart sounds. No murmur heard. Pulmonary:     Effort: Pulmonary effort is normal.     Breath sounds: Normal breath sounds. No wheezing.  Abdominal:     General: Bowel sounds are normal.     Palpations: Abdomen is soft.     Tenderness: There is no abdominal tenderness. There is no right CVA tenderness or left CVA tenderness.  Musculoskeletal:        General: Normal range of motion.     Cervical back: Normal range of motion.     Right lower leg: No edema.     Left lower leg: No edema.  Skin:    General: Skin is warm and dry.  Neurological:     General: No focal deficit present.      Mental Status: She is alert and oriented to person, place, and time.  Psychiatric:        Mood and Affect: Mood normal.        Behavior: Behavior normal.  No results found for any visits on 09/28/23.  No results found for this or any previous visit (from the past 2160 hour(s)).    Assessment & Plan:  Check labs today.  Schedule CT scan of the head and a 2D echo. May need to go to ED if symptoms get worse. Will schedule mammogram at next visit.  Problem List Items Addressed This Visit     Chronic gout   Relevant Orders   Uric acid   Other Visit Diagnoses     Essential hypertension, benign    -  Primary   Relevant Orders   CMP14+EGFR   Other chest pain       Relevant Orders   EKG 12-Lead   PCV ECHOCARDIOGRAM COMPLETE   Bradycardia       Relevant Orders   TSH+T4F+T3Free   Mixed hyperlipidemia       Relevant Orders   Lipid Panel w/o Chol/HDL Ratio   Palpitations       Relevant Orders   CBC with Diff   PCV ECHOCARDIOGRAM COMPLETE   Other headache syndrome       Relevant Orders   CT HEAD W & WO CONTRAST ( )       Follow up 10 days.  Total time spent: 30 minutes  Margaretann Loveless, MD  09/28/2023   This document may have been prepared by [facility] Voice Recognition software and as such may include unintentional dictation errors.

## 2023-09-29 LAB — CBC WITH DIFFERENTIAL/PLATELET
Basophils Absolute: 0 10*3/uL (ref 0.0–0.2)
Basos: 1 %
EOS (ABSOLUTE): 0.4 10*3/uL (ref 0.0–0.4)
Eos: 6 %
Hematocrit: 38.6 % (ref 34.0–46.6)
Hemoglobin: 12.6 g/dL (ref 11.1–15.9)
Immature Grans (Abs): 0 10*3/uL (ref 0.0–0.1)
Immature Granulocytes: 0 %
Lymphocytes Absolute: 1.7 10*3/uL (ref 0.7–3.1)
Lymphs: 24 %
MCH: 30.4 pg (ref 26.6–33.0)
MCHC: 32.6 g/dL (ref 31.5–35.7)
MCV: 93 fL (ref 79–97)
Monocytes Absolute: 0.5 10*3/uL (ref 0.1–0.9)
Monocytes: 7 %
Neutrophils Absolute: 4.6 10*3/uL (ref 1.4–7.0)
Neutrophils: 62 %
Platelets: 177 10*3/uL (ref 150–450)
RBC: 4.15 x10E6/uL (ref 3.77–5.28)
RDW: 13.4 % (ref 11.7–15.4)
WBC: 7.3 10*3/uL (ref 3.4–10.8)

## 2023-09-29 LAB — CMP14+EGFR
ALT: 13 [IU]/L (ref 0–32)
AST: 15 [IU]/L (ref 0–40)
Albumin: 4.6 g/dL (ref 3.9–4.9)
Alkaline Phosphatase: 97 [IU]/L (ref 44–121)
BUN/Creatinine Ratio: 13 (ref 12–28)
BUN: 15 mg/dL (ref 8–27)
Bilirubin Total: 0.5 mg/dL (ref 0.0–1.2)
CO2: 26 mmol/L (ref 20–29)
Calcium: 9.2 mg/dL (ref 8.7–10.3)
Chloride: 104 mmol/L (ref 96–106)
Creatinine, Ser: 1.16 mg/dL — ABNORMAL HIGH (ref 0.57–1.00)
Globulin, Total: 3 g/dL (ref 1.5–4.5)
Glucose: 90 mg/dL (ref 70–99)
Potassium: 4.1 mmol/L (ref 3.5–5.2)
Sodium: 144 mmol/L (ref 134–144)
Total Protein: 7.6 g/dL (ref 6.0–8.5)
eGFR: 53 mL/min/{1.73_m2} — ABNORMAL LOW (ref >59)

## 2023-09-29 LAB — URIC ACID: Uric Acid: 7.1 mg/dL (ref 3.0–7.2)

## 2023-09-29 LAB — LIPID PANEL W/O CHOL/HDL RATIO
Cholesterol, Total: 164 mg/dL (ref 100–199)
HDL: 41 mg/dL (ref >39)
LDL Chol Calc (NIH): 91 mg/dL (ref 0–99)
Triglycerides: 188 mg/dL — ABNORMAL HIGH (ref 0–149)
VLDL Cholesterol Cal: 32 mg/dL (ref 5–40)

## 2023-09-29 LAB — TSH+T4F+T3FREE
Free T4: 1.2 ng/dL (ref 0.82–1.77)
T3, Free: 2.9 pg/mL (ref 2.0–4.4)
TSH: 3.29 u[IU]/mL (ref 0.450–4.500)

## 2023-10-10 ENCOUNTER — Ambulatory Visit (INDEPENDENT_AMBULATORY_CARE_PROVIDER_SITE_OTHER): Payer: 59

## 2023-10-10 DIAGNOSIS — G4489 Other headache syndrome: Secondary | ICD-10-CM | POA: Diagnosis not present

## 2023-10-10 MED ORDER — IOHEXOL 300 MG/ML  SOLN
100.0000 mL | Freq: Once | INTRAMUSCULAR | Status: AC | PRN
  Administered 2023-10-10: 100 mL via INTRAVENOUS

## 2023-10-11 ENCOUNTER — Other Ambulatory Visit: Payer: 59

## 2023-10-16 ENCOUNTER — Ambulatory Visit: Payer: 59 | Admitting: Internal Medicine

## 2023-10-23 ENCOUNTER — Encounter: Payer: Self-pay | Admitting: Internal Medicine

## 2023-10-23 ENCOUNTER — Ambulatory Visit: Payer: 59 | Admitting: Internal Medicine

## 2023-10-23 VITALS — BP 140/90 | HR 65 | Ht 61.0 in | Wt 249.0 lb

## 2023-10-23 DIAGNOSIS — I10 Essential (primary) hypertension: Secondary | ICD-10-CM | POA: Diagnosis not present

## 2023-10-23 DIAGNOSIS — G4489 Other headache syndrome: Secondary | ICD-10-CM

## 2023-10-23 DIAGNOSIS — E782 Mixed hyperlipidemia: Secondary | ICD-10-CM

## 2023-10-24 ENCOUNTER — Encounter: Payer: Self-pay | Admitting: Internal Medicine

## 2023-10-24 DIAGNOSIS — E782 Mixed hyperlipidemia: Secondary | ICD-10-CM | POA: Insufficient documentation

## 2023-10-24 DIAGNOSIS — G4489 Other headache syndrome: Secondary | ICD-10-CM | POA: Insufficient documentation

## 2023-10-24 NOTE — Progress Notes (Signed)
Established Patient Office Visit  Subjective:  Patient ID: Amber Weiss, female    DOB: 1961-05-13  Age: 62 y.o. MRN: [id number]  Chief Complaint  Patient presents with   Follow-up    10 days    Patient comes in for her follow-up today.  She had a negative CT scan of the head.  And she is also feeling better and reports that her  headache has resolved.  Her blood pressure is slightly elevated today.  She is currently on Norvasc and says she takes it regularly.  Will monitor at home also.  She missed her echocardiogram appointment, will reschedule.    No other concerns at this time.   Past Medical History:  Diagnosis Date   Asthma    Gout    Hypertension    OSA (obstructive sleep apnea)     Past Surgical History:  Procedure Laterality Date   CESAREAN SECTION     COLONOSCOPY  06/09/11    Social History   Socioeconomic History   Marital status: Married    Spouse name: Not on file   Number of children: Not on file   Years of education: Not on file   Highest education level: Not on file  Occupational History   Not on file  Tobacco Use   Smoking status: Former    Current packs/day: 0.00    Average packs/day: 1 pack/day for 12.0 years (12.0 ttl pk-yrs)    Types: Cigarettes    Start date: 09/29/1980    Quit date: 09/29/1992    Years since quitting: 31.0   Smokeless tobacco: Never  Vaping Use   Vaping status: Never Used  Substance and Sexual Activity   Alcohol use: No   Drug use: No   Sexual activity: Not on file  Other Topics Concern   Not on file  Social History Narrative   Not on file   Social Determinants of Health   Financial Resource Strain: Not on file  Food Insecurity: Not on file  Transportation Needs: Not on file  Physical Activity: Not on file  Stress: Not on file  Social Connections: Not on file  Intimate Partner Violence: Not on file    Family History  Problem Relation Age of Onset   Cancer Mother        breast   Hypertension Mother     Emphysema Mother    Breast cancer Mother 6   Cancer Father        skin   Diabetes Father    Cancer Sister        bladder   Heart disease Neg Hx    Stroke Neg Hx     Allergies  Allergen Reactions   Penicillins    Sulfa Antibiotics Other (See Comments)    Unknown reaction     Outpatient Medications Prior to Visit  Medication Sig   albuterol (VENTOLIN HFA) 108 (90 Base) MCG/ACT inhaler Inhale 2 puffs into the lungs every 6 (six) hours as needed for wheezing or shortness of breath.   allopurinol (ZYLOPRIM) 100 MG tablet TAKE 1 TABLET BY MOUTH EVERY DAY   amLODipine (NORVASC) 5 MG tablet TAKE 1 TABLET BY MOUTH EVERY DAY   colchicine 0.6 MG tablet Take 0.6 mg by mouth daily.   No facility-administered medications prior to visit.    Review of Systems  Constitutional:  Negative for chills, fever and weight loss.  HENT:  Negative for congestion and sinus pain.   Eyes: Negative.  Negative for  blurred vision.  Respiratory: Negative.  Negative for cough and shortness of breath.   Cardiovascular: Negative.  Negative for chest pain, palpitations and leg swelling.  Gastrointestinal: Negative.  Negative for abdominal pain, blood in stool, constipation, diarrhea, heartburn, nausea and vomiting.  Genitourinary: Negative.  Negative for dysuria and flank pain.  Musculoskeletal: Negative.  Negative for joint pain and myalgias.  Skin: Negative.   Neurological: Negative.  Negative for dizziness and headaches.  Endo/Heme/Allergies: Negative.   Psychiatric/Behavioral: Negative.  Negative for depression and suicidal ideas. The patient is not nervous/anxious.        Objective:   BP (!) 140/90   Pulse 65   Ht 5\' 1"  (1.549 m)   Wt 249 lb (112.9 kg)   SpO2 100%   BMI 47.05 kg/m   Vitals:   10/23/23 1555  BP: (!) 140/90  Pulse: 65  Height: 5\' 1"  (1.549 m)  Weight: 249 lb (112.9 kg)  SpO2: 100%  BMI (Calculated): 47.07    Physical Exam Vitals and nursing note reviewed.   Constitutional:      Appearance: Normal appearance.  HENT:     Head: Normocephalic and atraumatic.     Nose: Nose normal.     Mouth/Throat:     Mouth: Mucous membranes are moist.     Pharynx: Oropharynx is clear.  Eyes:     Conjunctiva/sclera: Conjunctivae normal.     Pupils: Pupils are equal, round, and reactive to light.  Cardiovascular:     Rate and Rhythm: Normal rate and regular rhythm.     Pulses: Normal pulses.     Heart sounds: Normal heart sounds. No murmur heard. Pulmonary:     Effort: Pulmonary effort is normal.     Breath sounds: Normal breath sounds. No wheezing.  Abdominal:     General: Bowel sounds are normal.     Palpations: Abdomen is soft.     Tenderness: There is no abdominal tenderness. There is no right CVA tenderness or left CVA tenderness.  Musculoskeletal:        General: Normal range of motion.     Cervical back: Normal range of motion.     Right lower leg: No edema.     Left lower leg: No edema.  Skin:    General: Skin is warm and dry.  Neurological:     General: No focal deficit present.     Mental Status: She is alert and oriented to person, place, and time.  Psychiatric:        Mood and Affect: Mood normal.        Behavior: Behavior normal.      No results found for any visits on 10/23/23.  Recent Results (from the past 2160 hour(s))  CBC with Diff     Status: None   Collection Time: 09/28/23  3:14 PM  Result Value Ref Range   WBC 7.3 3.4 - 10.8 x10E3/uL   RBC 4.15 3.77 - 5.28 x10E6/uL   Hemoglobin 12.6 11.1 - 15.9 g/dL   Hematocrit 40.9 81.1 - 46.6 %   MCV 93 79 - 97 fL   MCH 30.4 26.6 - 33.0 pg   MCHC 32.6 31.5 - 35.7 g/dL   RDW 91.4 78.2 - 95.6 %   Platelets 177 150 - 450 x10E3/uL   Neutrophils 62 Not Estab. %   Lymphs 24 Not Estab. %   Monocytes 7 Not Estab. %   Eos 6 Not Estab. %   Basos 1 Not Estab. %  Neutrophils Absolute 4.6 1.4 - 7.0 x10E3/uL   Lymphocytes Absolute 1.7 0.7 - 3.1 x10E3/uL   Monocytes Absolute 0.5 0.1  - 0.9 x10E3/uL   EOS (ABSOLUTE) 0.4 0.0 - 0.4 x10E3/uL   Basophils Absolute 0.0 0.0 - 0.2 x10E3/uL   Immature Granulocytes 0 Not Estab. %   Immature Grans (Abs) 0.0 0.0 - 0.1 x10E3/uL  Uric acid     Status: None   Collection Time: 09/28/23  3:14 PM  Result Value Ref Range   Uric Acid 7.1 3.0 - 7.2 mg/dL    Comment:            Therapeutic target for gout patients: <6.0  Lipid Panel w/o Chol/HDL Ratio     Status: Abnormal   Collection Time: 09/28/23  3:14 PM  Result Value Ref Range   Cholesterol, Total 164 100 - 199 mg/dL   Triglycerides 409 (H) 0 - 149 mg/dL   HDL 41 >81 mg/dL   VLDL Cholesterol Cal 32 5 - 40 mg/dL   LDL Chol Calc (NIH) 91 0 - 99 mg/dL  [id number]     Status: None   Collection Time: 09/28/23  3:14 PM  Result Value Ref Range   TSH 3.290 0.450 - 4.500 uIU/mL   T3, Free 2.9 2.0 - 4.4 pg/mL   Free T4 1.20 0.82 - 1.77 ng/dL  [id number]+HQIO     Status: Abnormal   Collection Time: 09/28/23  3:14 PM  Result Value Ref Range   Glucose 90 70 - 99 mg/dL   BUN 15 8 - 27 mg/dL   Creatinine, Ser 9.62 (H) 0.57 - 1.00 mg/dL   eGFR 53 (L) >95 MW/UXL/2.44   BUN/Creatinine Ratio 13 12 - 28   Sodium 144 134 - 144 mmol/L   Potassium 4.1 3.5 - 5.2 mmol/L   Chloride 104 96 - 106 mmol/L   CO2 26 20 - 29 mmol/L   Calcium 9.2 8.7 - 10.3 mg/dL   Total Protein 7.6 6.0 - 8.5 g/dL   Albumin 4.6 3.9 - 4.9 g/dL   Globulin, Total 3.0 1.5 - 4.5 g/dL   Bilirubin Total 0.5 0.0 - 1.2 mg/dL   Alkaline Phosphatase 97 44 - 121 IU/L   AST 15 0 - 40 IU/L   ALT 13 0 - 32 IU/L      Assessment & Plan:  Continue all medications for now.  Patient to be scheduled for mammogram and echocardiogram.  Monitor blood pressure at home. Problem List Items Addressed This Visit     Essential hypertension, benign - Primary   Mixed hyperlipidemia   Other headache syndrome    Return in about 1 month (around 11/23/2023).   Total time spent: 25 minutes  Margaretann Loveless, MD  10/23/2023   This document  may have been prepared by [facility] Voice Recognition software and as such may include unintentional dictation errors.

## 2023-11-23 ENCOUNTER — Other Ambulatory Visit: Payer: Self-pay | Admitting: Internal Medicine

## 2023-11-23 ENCOUNTER — Ambulatory Visit: Payer: 59 | Admitting: Internal Medicine

## 2024-06-13 ENCOUNTER — Other Ambulatory Visit: Payer: Self-pay | Admitting: Internal Medicine

## 2024-07-17 ENCOUNTER — Other Ambulatory Visit: Payer: Self-pay | Admitting: Cardiology

## 2024-09-26 ENCOUNTER — Other Ambulatory Visit: Payer: Self-pay | Admitting: Internal Medicine

## 2024-10-02 ENCOUNTER — Telehealth: Payer: Self-pay | Admitting: Internal Medicine

## 2024-10-02 NOTE — Telephone Encounter (Signed)
 Patient left VM stating she is needing refills but did not state for which medication. She has not been seen since last December so she will need an appt before any refills can be sent in.

## 2024-10-04 ENCOUNTER — Ambulatory Visit: Payer: Self-pay

## 2024-10-04 NOTE — Telephone Encounter (Signed)
   Copied from CRM [id number]. Topic: Clinical - Red Word Triage >> Oct 04, 2024 12:09 PM Fonda T wrote: [facility] that prompted transfer to Nurse Triage: Pt calling to schedule a new pt appt, however she reports she is having chest tightness, difficulty breathing, and coughing, and is asthmatic.   Pt requesting an appt for evaluation as soon as possible. Reason for Disposition  [1] MILD asthma attack (e.g., no SOB at rest, mild SOB with walking, speaks normally in sentences, mild wheezing) AND [2] lasting > 24 hours on prescribed treatment  Answer Assessment - Initial Assessment Questions Patient called to establish care and evaluated asthma.  New patient appointment scheduled to establish care Patient is proceeding to urgent care to evaluate asthmatic cough with shortness of breath, and chest tightness.   1. RESPIRATORY STATUS: Describe your breathing? (e.g., wheezing, shortness of breath, unable to speak, severe coughing)      SOB, cough 2. ONSET: When did this asthma attack begin?       3. TRIGGER: What do you think triggered this attack? (e.g., URI, exposure to pollen or other allergen, tobacco smoke)       4. PEAK EXPIRATORY FLOW RATE (PEFR): Do you use a peak flow meter? If Yes, ask: What's the current peak flow? What's your personal best peak flow?       5. SEVERITY: How bad is this attack?      Speaking in full sentences on this call.  6. ASTHMA MEDICINES:  What treatments have you tried?       7. INHALED QUICK-RELIEF TREATMENTS FOR THIS ATTACK: What treatments have you given yourself so far? and How many and how often? If using an inhaler, ask, How many puffs? Note: Routine treatments are 2 puffs every 4 hours as needed. Rescue treatments are 4 puffs repeated every 20 minutes, up to three times as needed.      As prescribed, helpful but cough and chest tightness persisting.  8. OTHER SYMPTOMS: Do you have any other symptoms? (e.g., chest pain, coughing up  yellow sputum, fever, runny nose)      9. O2 SATURATION MONITOR:  Do you use an oxygen saturation monitor (pulse oximeter) at home? If Yes, What is your reading (oxygen level) today? What is your usual oxygen saturation reading? (e.g., 95%)      10. PREGNANCY: Is there any chance you are pregnant? When was your last menstrual period?  Protocols used: Asthma Attack-A-AH

## 2024-10-18 ENCOUNTER — Other Ambulatory Visit: Payer: Self-pay | Admitting: Internal Medicine

## 2024-10-22 ENCOUNTER — Other Ambulatory Visit: Payer: Self-pay | Admitting: Internal Medicine

## 2024-10-25 ENCOUNTER — Ambulatory Visit: Payer: Self-pay

## 2024-10-25 VITALS — BP 130/90 | HR 65 | Ht 61.0 in | Wt 253.2 lb

## 2024-10-25 DIAGNOSIS — I517 Cardiomegaly: Secondary | ICD-10-CM | POA: Diagnosis not present

## 2024-10-25 DIAGNOSIS — Z1231 Encounter for screening mammogram for malignant neoplasm of breast: Secondary | ICD-10-CM

## 2024-10-25 DIAGNOSIS — G4733 Obstructive sleep apnea (adult) (pediatric): Secondary | ICD-10-CM | POA: Diagnosis not present

## 2024-10-25 DIAGNOSIS — K760 Fatty (change of) liver, not elsewhere classified: Secondary | ICD-10-CM | POA: Diagnosis not present

## 2024-10-25 DIAGNOSIS — I10 Essential (primary) hypertension: Secondary | ICD-10-CM | POA: Diagnosis not present

## 2024-10-25 DIAGNOSIS — R161 Splenomegaly, not elsewhere classified: Secondary | ICD-10-CM

## 2024-10-25 DIAGNOSIS — E782 Mixed hyperlipidemia: Secondary | ICD-10-CM | POA: Diagnosis not present

## 2024-10-25 DIAGNOSIS — E559 Vitamin D deficiency, unspecified: Secondary | ICD-10-CM

## 2024-10-25 DIAGNOSIS — R0789 Other chest pain: Secondary | ICD-10-CM | POA: Diagnosis not present

## 2024-10-25 DIAGNOSIS — D649 Anemia, unspecified: Secondary | ICD-10-CM

## 2024-10-25 DIAGNOSIS — E538 Deficiency of other specified B group vitamins: Secondary | ICD-10-CM

## 2024-10-25 DIAGNOSIS — M1A09X0 Idiopathic chronic gout, multiple sites, without tophus (tophi): Secondary | ICD-10-CM | POA: Diagnosis not present

## 2024-10-25 MED ORDER — ALBUTEROL SULFATE HFA 108 (90 BASE) MCG/ACT IN AERS: 2.0000 | INHALATION_SPRAY | Freq: Four times a day (QID) | RESPIRATORY_TRACT | 6 refills | Status: AC | PRN

## 2024-10-25 MED ORDER — BUDESONIDE-FORMOTEROL FUMARATE 160-4.5 MCG/ACT IN AERO: 2.0000 | INHALATION_SPRAY | Freq: Two times a day (BID) | RESPIRATORY_TRACT | 3 refills | Status: AC

## 2024-10-25 NOTE — Progress Notes (Signed)
 New Patient Visit   Physician: Sydni Elizarraraz A Kenzie Thoreson, MD  Patient: Amber Weiss   DOB: 1961/09/12   63 y.o. Female  MRN: [id number] Visit Date: 10/25/2024   Chief Complaint  Patient presents with   Establish Care   Subjective  Amber Weiss is a 63 y.o. female who presents today as a new patient to establish care.   HPI  Discussed the use of AI scribe software for clinical note transcription with the patient, who gave verbal consent to proceed.  History of Present Illness   [name] is a 63 year old female with asthma and sleep apnea who presents for a prescription refill and evaluation of ear symptoms.  Asthma symptoms and inhaler use - Increased use of albuterol  inhaler, approximately two to three times daily, especially with weather changes - Persistent wheezing and chest tightness, most pronounced in the mornings - Morning symptoms accompanied by nausea, suspected to be related to nasal congestion - Chest pressure episodes typically relieved by inhaler use, though not always completely - No family history of heart disease and no known cholesterol issues  Ear symptoms and hearing changes - Ear symptoms began a couple of months ago with drainage - Small amount of blood observed after Q-tip use - No associated ear pain - Difficulty hearing  Sleep disturbance and sleep apnea - Diagnosed with sleep apnea approximately five years ago - Uses CPAP machine but does not feel rested upon waking - CPAP machine last checked about one year ago - Recent weight gain  Weight changes and lifestyle - Morbid obesity - Significant weight gain after previous weight loss through exercise and keto diet - No current exercise, attributing to falling out of habit - Alcohol use infrequent, about three times a year - No tobacco use  Chest heaviness - exertional and non.  She relates this to asthma but symptoms do not always improve with inhaler use.  No FH history CAD, lipids reported  in normal range - lower HDL, higher triglycerides.  OSA many years before dx.   Gout and hypertension - History of gout, managed with allopurinol  and colchicine   Hypertension - Hypertension managed with amlodipine  5 mg daily - stable.  Usually less <130/90 at home  Splenomegaly (history) - CT scan in 2018 noted enlarged spleen, no further details or reasons recalled  Preventive care - No recent mammograms or Pap exams - No recent vaccinations         ASSESSMENT & PLAN  Encounter Diagnoses  Name Primary?   Vitamin D  deficiency Yes   Vitamin B12 deficiency    Anemia, unspecified type    Mixed hyperlipidemia    Splenomegaly    OSA (obstructive sleep apnea)    Morbid obesity (HCC)    Fatty liver    Essential hypertension, benign    Idiopathic chronic gout of multiple sites without tophus    Cardiomegaly     Orders Placed This Encounter  Procedures   CBC with Differential/Platelet   Comprehensive metabolic panel with GFR   Hemoglobin A1c   Lipid panel   Urinalysis, Routine w reflex microscopic   B12 and Folate Panel   VITAMIN D  25 Hydroxy (Vit-D Deficiency, Fractures)   EKG 12-Lead    Assessment and Plan    Asthma Increased albuterol  use due to weather changes. Morning nausea possibly from postnasal drip. - Prescribed Symbicort for baseline control. - Advised saline sinus flushing at bedtime.  Obstructive sleep apnea CPAP use reported but not  recently checked. Reports not feeling rested. - Referred for CPAP machine check.  Splenomegaly Spleen size 15 cm on previous CT. No clear etiology. - Ordered repeat ultrasound to assess spleen size and etiology.  Mixed hyperlipidemia No recent blood work. Discussed cardiovascular risk factors. - Ordered blood work to assess cholesterol levels.  Cardiomegaly - No ECG.  Obtained today showing NSR, normal result.    Gout Managed with allourinol and colchicine  prn.  Stable and well controlled  Hypertension Managed  with amlodipine  5 mg. No recent home blood pressure monitoring.  General Health Maintenance Discussed importance of regular screenings and vaccinations. No recent mammogram or flu shot. - Ordered mammogram. - Discussed flu vaccination.     Chest heaviness - CCTA would give the best overall result.  Given obesity, other modalities less sensitive.  Moderate pre-probability in the setting of chest heaviness.     Objective  BP (!) 130/90   Pulse 65   Ht 5' 1 (1.549 m)   Wt 253 lb 3.2 oz (114.9 kg)   SpO2 94%   BMI 47.84 kg/m      Review of Systems  Constitutional:  Negative for chills, fever and weight loss.  Eyes:  Negative for blurred vision. h Respiratory:  Negative for cough and shortness of breath.   Cardiovascular:  Negative for chest pain and palpitations.  Skin:  Negative for rash.  Psychiatric/Behavioral:  Negative for depression. The patient is not nervous/anxious.      Physical Exam Physical Exam Vitals reviewed.  Constitutional:      Appearance: Normal appearance. Well-developed with normal weight.  HENT:     Head: Normocephalic and atraumatic.  Normal mucous membranes, no oral lesions Eyes:     Pupils: Pupils are equal, round, and reactive to light.  Neck:     Thyroid: No thyroid mass or thyromegaly.  Cardiovascular:     Rate and Rhythm: Normal rate and regular rhythm. Normal heart sounds. Normal peripheral pulses Pulmonary:     Normal breath sounds with normal effort Abdominal:   Abdomen is soft, without tenderness or noted hepatosplenomegaly Musculoskeletal:        General: No swelling or edema  Lymphadenopathy:     Cervical: No cervical adenopathy.  Skin:    General: Skin is warm and dry without noticeable rash. Neurological:     General: No focal deficit present.  Psychiatric:        Mood and Affect: Mood, behavior and cognition normal   Past Medical History:  Diagnosis Date   Asthma    Gout    Hypertension    OSA (obstructive sleep  apnea)    Past Surgical History:  Procedure Laterality Date   CESAREAN SECTION     COLONOSCOPY  06/09/11   Family Status  Relation Name Status   Mother  Deceased       something lung related   Father  Deceased       skin cancer   Sister  (Not Specified)   Neg Hx  (Not Specified)  No partnership data on file   Family History  Problem Relation Age of Onset   Cancer Mother        breast   Hypertension Mother    Emphysema Mother    Breast cancer Mother 39   Cancer Father        skin   Diabetes Father    Cancer Sister        bladder   Heart disease Neg Hx  Stroke Neg Hx    Social History   Socioeconomic History   Marital status: Married    Spouse name: Not on file   Number of children: Not on file   Years of education: Not on file   Highest education level: Not on file  Occupational History   Not on file  Tobacco Use   Smoking status: Former    Current packs/day: 0.00    Average packs/day: 1 pack/day for 12.0 years (12.0 ttl pk-yrs)    Types: Cigarettes    Start date: 09/29/1980    Quit date: 09/29/1992    Years since quitting: 32.0   Smokeless tobacco: Never  Vaping Use   Vaping status: Never Used  Substance and Sexual Activity   Alcohol use: No   Drug use: No   Sexual activity: Not on file  Other Topics Concern   Not on file  Social History Narrative   Not on file   Social Drivers of Health   Tobacco Use: Medium Risk (10/24/2023)   Patient History    Smoking Tobacco Use: Former    Smokeless Tobacco Use: Never    Passive Exposure: Not on Actuary Strain: Not on file  Food Insecurity: Not on file  Transportation Needs: Not on file  Physical Activity: Not on file  Stress: Not on file  Social Connections: Not on file  Depression (PHQ2-9): Not on file  Alcohol Screen: Not on file  Housing: Not on file  Utilities: Not on file  Health Literacy: Not on file   Show/hide medication list[1] Allergies[2]  Immunization History   Administered Date(s) Administered   Influenza,inj,Quad PF,6+ Mos 09/30/2015   Influenza-Unspecified 09/30/2015   Pneumococcal Polysaccharide-23 06/14/2012   Td 04/04/2007    Health Maintenance  Topic Date Due   HIV Screening  Never done   Hepatitis C Screening  Never done   Zoster Vaccines- Shingrix (1 of 2) Never done   Pneumococcal Vaccine: 50+ Years (2 of 2 - PCV) 06/14/2013   DTaP/Tdap/Td (2 - Tdap) 04/03/2017   Colonoscopy  06/08/2021   Mammogram  04/16/2022   Cervical Cancer Screening (HPV/Pap Cotest)  07/31/2023   Influenza Vaccine  06/14/2024   COVID-19 Vaccine (1 - 2025-26 season) Never done   Hepatitis B Vaccines 19-59 Average Risk  Aged Out   HPV VACCINES  Aged Out   Meningococcal B Vaccine  Aged Out    Patient Care Team: Everlene Parris LABOR, MD as PCP - General (Family Medicine)  Depression Screen    05/08/2020    3:18 PM 12/20/2019   11:23 AM 05/06/2019    1:05 PM 07/30/2018    1:42 PM  PHQ 2/9 Scores  PHQ - 2 Score 0 0 0 0  PHQ- 9 Score 2  0  0  4      Data saved with a previous flowsheet row definition     Parris LABOR Everlene, MD  [facility] [phone] (phone) [phone] (fax)  [facility]    [1]  Outpatient Medications Prior to Visit  Medication Sig   allopurinol  (ZYLOPRIM ) 100 MG tablet TAKE 1 TABLET BY MOUTH EVERY DAY   amLODipine  (NORVASC ) 5 MG tablet TAKE 1 TABLET BY MOUTH EVERY DAY   colchicine  0.6 MG tablet Take 0.6 mg by mouth daily.   [DISCONTINUED] albuterol  (VENTOLIN  HFA) 108 (90 Base) MCG/ACT inhaler Inhale 2 puffs into the lungs every 6 (six) hours as needed for wheezing or shortness of breath.  No facility-administered medications prior to visit.  [2]  Allergies Allergen Reactions   Penicillins    Sulfa  Antibiotics Other (See Comments)    Unknown reaction

## 2024-10-26 LAB — URINALYSIS, ROUTINE W REFLEX MICROSCOPIC
Bacteria, UA: NONE SEEN /HPF
Bilirubin Urine: NEGATIVE
Glucose, UA: NEGATIVE
Hyaline Cast: NONE SEEN /LPF
Ketones, ur: NEGATIVE
Nitrite: NEGATIVE
Protein, ur: NEGATIVE
RBC / HPF: NONE SEEN /HPF (ref 0–2)
Specific Gravity, Urine: 1.015 (ref 1.001–1.035)
pH: <=5 — AB (ref 5.0–8.0)

## 2024-10-26 LAB — LIPID PANEL
Cholesterol: 160 mg/dL (ref <200)
HDL: 44 mg/dL — ABNORMAL LOW (ref >=50)
LDL Cholesterol (Calc): 91 mg/dL
Non-HDL Cholesterol (Calc): 116 mg/dL (ref <130)
Total CHOL/HDL Ratio: 3.6 (calc) (ref <5.0)
Triglycerides: 158 mg/dL — ABNORMAL HIGH (ref <150)

## 2024-10-26 LAB — COMPREHENSIVE METABOLIC PANEL WITH GFR
AG Ratio: 1.4 (calc) (ref 1.0–2.5)
ALT: 14 U/L (ref 6–29)
AST: 18 U/L (ref 10–35)
Albumin: 4.4 g/dL (ref 3.6–5.1)
Alkaline phosphatase (APISO): 91 U/L (ref 37–153)
BUN: 17 mg/dL (ref 7–25)
CO2: 30 mmol/L (ref 20–32)
Calcium: 9.3 mg/dL (ref 8.6–10.4)
Chloride: 103 mmol/L (ref 98–110)
Creat: 1.03 mg/dL (ref 0.50–1.05)
Globulin: 3.2 g/dL (ref 1.9–3.7)
Glucose, Bld: 77 mg/dL (ref 65–99)
Potassium: 3.9 mmol/L (ref 3.5–5.3)
Sodium: 142 mmol/L (ref 135–146)
Total Bilirubin: 0.6 mg/dL (ref 0.2–1.2)
Total Protein: 7.6 g/dL (ref 6.1–8.1)
eGFR: 61 mL/min/1.73m2 (ref >=60)

## 2024-10-26 LAB — CBC WITH DIFFERENTIAL/PLATELET
Absolute Lymphocytes: 1340 {cells}/uL (ref 850–3900)
Absolute Monocytes: 322 {cells}/uL (ref 200–950)
Basophils Absolute: 40 {cells}/uL (ref 0–200)
Basophils Relative: 0.6 %
Eosinophils Absolute: 422 {cells}/uL (ref 15–500)
Eosinophils Relative: 6.3 %
HCT: 39.7 % (ref 35.9–46.0)
Hemoglobin: 12.8 g/dL (ref 11.7–15.5)
MCH: 30.1 pg (ref 27.0–33.0)
MCHC: 32.2 g/dL (ref 31.6–35.4)
MCV: 93.4 fL (ref 81.4–101.7)
MPV: 11.7 fL (ref 7.5–12.5)
Monocytes Relative: 4.8 %
Neutro Abs: 4576 {cells}/uL (ref 1500–7800)
Neutrophils Relative %: 68.3 %
Platelets: 163 Thousand/uL (ref 140–400)
RBC: 4.25 Million/uL (ref 3.80–5.10)
RDW: 14.7 % (ref 11.0–15.0)
Total Lymphocyte: 20 %
WBC: 6.7 Thousand/uL (ref 3.8–10.8)

## 2024-10-26 LAB — HEMOGLOBIN A1C
Hgb A1c MFr Bld: 5.1 % (ref <5.7)
Mean Plasma Glucose: 100 mg/dL
eAG (mmol/L): 5.5 mmol/L

## 2024-10-26 LAB — VITAMIN D 25 HYDROXY (VIT D DEFICIENCY, FRACTURES): Vit D, 25-Hydroxy: 23 ng/mL — ABNORMAL LOW (ref 30–100)

## 2024-10-26 LAB — B12 AND FOLATE PANEL
Folate: 9 ng/mL
Vitamin B-12: 421 pg/mL (ref 200–1100)

## 2024-10-26 LAB — MICROSCOPIC MESSAGE

## 2024-11-13 ENCOUNTER — Other Ambulatory Visit: Payer: Self-pay

## 2024-11-15 ENCOUNTER — Ambulatory Visit: Admission: RE | Admit: 2024-11-15 | Source: Ambulatory Visit

## 2024-11-19 ENCOUNTER — Encounter (HOSPITAL_COMMUNITY): Payer: Self-pay

## 2024-11-20 ENCOUNTER — Telehealth: Payer: Self-pay

## 2024-11-20 NOTE — Telephone Encounter (Signed)
 Copied from CRM [id number]. Topic: General - Other >> Nov 20, 2024  9:16 AM Everette C wrote: Reason for CRM: Tricia with [facility] has called to request authorization on an upcoming CT scan/ imaging procedure scheduled for 11/21/24 at 8 AM  Please contact Tricia at [phone] ext [phone] if needed further

## 2024-11-21 ENCOUNTER — Ambulatory Visit
Admission: RE | Admit: 2024-11-21 | Discharge: 2024-11-21 | Disposition: A | Discharge status: Home / Self Care | Payer: Self-pay | Source: Ambulatory Visit

## 2024-11-21 DIAGNOSIS — G4733 Obstructive sleep apnea (adult) (pediatric): Secondary | ICD-10-CM

## 2024-11-21 DIAGNOSIS — I517 Cardiomegaly: Secondary | ICD-10-CM

## 2024-11-21 MED ORDER — NITROGLYCERIN 0.4 MG SL SUBL
0.8000 mg | SUBLINGUAL_TABLET | Freq: Once | SUBLINGUAL | Status: DC
  Filled 2024-11-21: qty 25

## 2024-11-21 MED ORDER — METOPROLOL TARTRATE 5 MG/5ML IV SOLN: 10.0000 mg | INTRAVENOUS | Status: DC | PRN

## 2024-11-21 MED ORDER — DILTIAZEM HCL 25 MG/5ML IV SOLN
10.0000 mg | INTRAVENOUS | Status: DC | PRN
  Filled 2024-11-21: qty 5

## 2024-11-21 NOTE — Progress Notes (Signed)
 Pt arrived for her cardiac CT at [facility]. She was listed on the schedule in epic but cardiac navigators had crossed her off of the listed they provided. Reached out to cardiac navigators to verify that it was ok to prep patient and complete her scan. They reached out to Dr. Alec office to clarify insurance pre auth. They confirmed that it was still pending auth through insurance. Explained this to patient and informed that cardiac navigators will reach out to her to reschedule.

## 2024-11-22 ENCOUNTER — Other Ambulatory Visit (HOSPITAL_COMMUNITY): Payer: Self-pay | Admitting: Emergency Medicine

## 2024-11-22 ENCOUNTER — Ambulatory Visit: Admission: RE | Admit: 2024-11-22 | Discharge: 2024-11-22 | Disposition: A | Source: Ambulatory Visit

## 2024-11-22 ENCOUNTER — Ambulatory Visit: Payer: Self-pay

## 2024-11-22 VITALS — BP 140/86 | HR 69 | Ht 61.0 in | Wt 252.8 lb

## 2024-11-22 DIAGNOSIS — R161 Splenomegaly, not elsewhere classified: Secondary | ICD-10-CM | POA: Insufficient documentation

## 2024-11-22 DIAGNOSIS — I10 Essential (primary) hypertension: Secondary | ICD-10-CM | POA: Diagnosis not present

## 2024-11-22 DIAGNOSIS — E559 Vitamin D deficiency, unspecified: Secondary | ICD-10-CM

## 2024-11-22 DIAGNOSIS — R0789 Other chest pain: Secondary | ICD-10-CM | POA: Diagnosis not present

## 2024-11-22 DIAGNOSIS — E782 Mixed hyperlipidemia: Secondary | ICD-10-CM | POA: Diagnosis not present

## 2024-11-22 NOTE — Progress Notes (Signed)
 "           Progress Note  Physician: Parris DELENA Juneau, MD   HPI: Amber Weiss is a 64 y.o. female presenting on 11/22/2024 for Follow-up .  Discussed the use of AI scribe software for clinical note transcription with the patient, who gave verbal consent to proceed.  History of Present Illness   Amber Weiss is a 64 year old female with morbid obesity and obstructive sleep apnea who presents with chest heaviness.  Chest heaviness and exertional symptoms - Persistent chest heaviness, worsened with exertion such as climbing stairs - Associated with increased heart rate during episodes - Awaiting results of coronary CT angiography (CCTA) ordered for evaluation of chest symptoms  Hypertension - Did not take blood pressure medication the day prior to visit - Blood pressure measured at 140/86 mmHg during visit  Asthma and respiratory symptoms - Asthma with frequent wheezing, minimal cough - Uses albuterol  inhaler a couple of times daily as rescue therapy - Symbicort  prescribed at last visit but not yet started - Significant nocturnal sinus drainage, perceived to contribute to wheezing  Obstructive sleep apnea and morbid obesity - History of obstructive sleep apnea and morbid obesity  Splenomegaly and laboratory findings - Ultrasound to assess splenomegaly seen on prior CT delayed due to insurance, rescheduled for January 26 - Recent blood work: normal renal function, triglycerides 158 mg/dL, mild decrease in HDL, vitamin D  deficiency (level 23), normal CBC  Ear symptoms - Intermittent ear drainage and occasional pain, attributed to cerumen impaction - Infrequent use of Q-tips - Recent observation of dark cerumen         Medical history:  Relevant past medical, surgical, family and social history reviewed and updated as indicated. Interim medical history since our last visit reviewed.  Allergies and medications reviewed and updated.   ROS: Negative unless specifically  indicated above in HPI.   Current Medications[1]       Objective:     Recent Results (from the past 2160 hours)  CBC with Differential/Platelet     Status: None   Collection Time: 10/25/24 10:57 AM  Result Value Ref Range   WBC 6.7 3.8 - 10.8 Thousand/uL   RBC 4.25 3.80 - 5.10 Million/uL   Hemoglobin 12.8 11.7 - 15.5 g/dL   HCT 60.2 64.0 - 53.9 %   MCV 93.4 81.4 - 101.7 fL   MCH 30.1 27.0 - 33.0 pg   MCHC 32.2 31.6 - 35.4 g/dL   RDW 85.2 88.9 - 84.9 %   Platelets 163 140 - 400 Thousand/uL   MPV 11.7 7.5 - 12.5 fL   Neutro Abs 4,576 1,500 - 7,800 cells/uL   Absolute Lymphocytes 1,340 850 - 3,900 cells/uL   Absolute Monocytes 322 200 - 950 cells/uL   Eosinophils Absolute 422 15 - 500 cells/uL   Basophils Absolute 40 0 - 200 cells/uL   Neutrophils Relative % 68.3 %   Total Lymphocyte 20.0 %   Monocytes Relative 4.8 %   Eosinophils Relative 6.3 %   Basophils Relative 0.6 %  Comprehensive metabolic panel with GFR     Status: None   Collection Time: 10/25/24 10:57 AM  Result Value Ref Range   Glucose, Bld 77 65 - 99 mg/dL    Comment: .            Fasting reference interval .    BUN 17 7 - 25 mg/dL   Creat 8.96 9.49 - 8.94 mg/dL   eGFR 61 >  OR = 60 mL/min/1.7m2   BUN/Creatinine Ratio SEE NOTE: 6 - 22 (calc)    Comment:    Not Reported: BUN and Creatinine are within    reference range. .    Sodium 142 135 - 146 mmol/L   Potassium 3.9 3.5 - 5.3 mmol/L   Chloride 103 98 - 110 mmol/L   CO2 30 20 - 32 mmol/L   Calcium 9.3 8.6 - 10.4 mg/dL   Total Protein 7.6 6.1 - 8.1 g/dL   Albumin 4.4 3.6 - 5.1 g/dL   Globulin 3.2 1.9 - 3.7 g/dL (calc)   AG Ratio 1.4 1.0 - 2.5 (calc)   Total Bilirubin 0.6 0.2 - 1.2 mg/dL   Alkaline phosphatase (APISO) 91 37 - 153 U/L   AST 18 10 - 35 U/L   ALT 14 6 - 29 U/L  Hemoglobin A1c     Status: None   Collection Time: 10/25/24 10:57 AM  Result Value Ref Range   Hgb A1c MFr Bld 5.1 <5.7 %    Comment: For the purpose of screening for the  presence of diabetes: . <5.7%       Consistent with the absence of diabetes 5.7-6.4%    Consistent with increased risk for diabetes             (prediabetes) > or =6.5%  Consistent with diabetes . This assay result is consistent with a decreased risk of diabetes. . Currently, no consensus exists regarding use of hemoglobin A1c for diagnosis of diabetes in children. . According to American Diabetes Association (ADA) guidelines, hemoglobin A1c <7.0% represents optimal control in non-pregnant diabetic patients. Different metrics may apply to specific patient populations.  Standards of Medical Care in Diabetes(ADA). .    Mean Plasma Glucose 100 mg/dL   eAG (mmol/L) 5.5 mmol/L  Lipid panel     Status: Abnormal   Collection Time: 10/25/24 10:57 AM  Result Value Ref Range   Cholesterol 160 <200 mg/dL   HDL 44 (L) > OR = 50 mg/dL   Triglycerides 841 (H) <150 mg/dL   LDL Cholesterol (Calc) 91 mg/dL (calc)    Comment: Reference range: <100 . Desirable range <100 mg/dL for primary prevention;   <70 mg/dL for patients with CHD or diabetic patients  with > or = 2 CHD risk factors. Amber Weiss is now calculated using the Martin-Hopkins  calculation, which is a validated novel method providing  better accuracy than the Friedewald equation in the  estimation of Weiss.  Gladis APPLETHWAITE et al. SANDREA. 7986;689(80): 2061-2068  (http://education.[facility].com/faq/FAQ164)    Total CHOL/HDL Ratio 3.6 <5.0 (calc)   Non-HDL Cholesterol (Calc) 116 <130 mg/dL (calc)    Comment: For patients with diabetes plus 1 major ASCVD risk  factor, treating to a non-HDL-C goal of <100 mg/dL  (Weiss of <29 mg/dL) is considered a therapeutic  option.   Urinalysis, Routine w reflex microscopic     Status: Abnormal   Collection Time: 10/25/24 10:57 AM  Result Value Ref Range   Color, Urine YELLOW YELLOW   APPearance CLEAR CLEAR   Specific Gravity, Urine 1.015 1.001 - 1.035   pH < OR = 5.0 (A) 5.0 - 8.0    Glucose, UA NEGATIVE NEGATIVE   Bilirubin Urine NEGATIVE NEGATIVE   Ketones, ur NEGATIVE NEGATIVE   Hgb urine dipstick TRACE (A) NEGATIVE   Protein, ur NEGATIVE NEGATIVE   Nitrite NEGATIVE NEGATIVE   Leukocytes,Ua 2+ (A) NEGATIVE   WBC, UA 40-60 (A) 0 - 5 /HPF  RBC / HPF NONE SEEN 0 - 2 /HPF   Squamous Epithelial / HPF 6-10 (A) < OR = 5 /HPF   Bacteria, UA NONE SEEN NONE SEEN /HPF   Hyaline Cast NONE SEEN NONE SEEN /LPF  B12 and Folate Panel     Status: None   Collection Time: 10/25/24 10:57 AM  Result Value Ref Range   Vitamin B-12 421 200 - 1,100 pg/mL   Folate 9.0 ng/mL    Comment:                            Reference Range                            Low:           <3.4                            Borderline:    3.4-5.4                            Normal:        >5.4 .   VITAMIN D  25 Hydroxy (Vit-D Deficiency, Fractures)     Status: Abnormal   Collection Time: 10/25/24 10:57 AM  Result Value Ref Range   Vit D, 25-Hydroxy 23 (L) 30 - 100 ng/mL    Comment: Vitamin D  Status         25-OH Vitamin D : . Deficiency:                    <20 ng/mL Insufficiency:             20 - 29 ng/mL Optimal:                 > or = 30 ng/mL . For 25-OH Vitamin D  testing on patients on  D2-supplementation and patients for whom quantitation  of D2 and D3 fractions is required, the Quest[employer]ureD(TM) 25-OH VIT D, (D2,D3), LC/MS/MS is recommended: order  code 07111 (patients >34yrs). . See Note 1 . Note 1 . For additional information, please refer to  http://education.[facility].com/faq/FAQ199  (This link is being provided for informational/ educational purposes only.)   MICROSCOPIC MESSAGE     Status: None   Collection Time: 10/25/24 10:57 AM  Result Value Ref Range   Note      Comment: This urine was analyzed for the presence of WBC,  RBC, bacteria, casts, and other formed elements.  Only those elements seen were reported. . .     BP (!) 140/86 (BP Location: Left Arm, Patient  Position: Sitting, Cuff Size: Large)   Pulse 69   Ht 5' 1 (1.549 m)   Wt 252 lb 12.8 oz (114.7 kg)   SpO2 92%   BMI 47.77 kg/m   Wt Readings from Last 3 Encounters:  11/22/24 252 lb 12.8 oz (114.7 kg)  10/25/24 253 lb 3.2 oz (114.9 kg)  10/23/23 249 lb (112.9 kg)    Physical Exam  Physical Exam Vitals reviewed.  Constitutional:      Appearance: Normal appearance. Well-developed with normal weight.  Cardiovascular:     Rate and Rhythm: Normal rate and regular rhythm. Normal heart sounds. Normal peripheral pulses Pulmonary:     Normal breath sounds with normal effort, few rhonchi upper lung fields Skin:  General: Skin is warm and dry without noticeable rash. Neurological:     General: No focal deficit present.  Psychiatric:        Mood and Affect: Mood, behavior and cognition normal      Assessment & Plan:   Encounter Diagnoses  Name Primary?   Vitamin D  deficiency Yes   Morbid obesity (HCC)    Mixed hyperlipidemia    Essential hypertension, benign    Chest heaviness     No orders of the defined types were placed in this encounter.    Assessment and Plan    Chest heaviness evaluation Persistent chest heaviness, especially with exertion. Differential includes cardiac causes given morbid obesity and obstructive sleep apnea. Awaiting CCTA results.  Stable.  May consider pulmonary imaging if no cardiac etiology  Morbid obesity Contributing to obstructive sleep apnea and possibly chest heaviness. Discussed challenges of weight loss post-menopause and lifestyle habits. - Encouraged regular physical activity, such as walking, to aid in weight management.  Persistent asthma Chronic asthma with recent increase in rescue inhaler use. Discussed benefits of Symbicort  for baseline control and reducing reliance on rescue inhaler. - Start Symbicort  inhaler in the morning for baseline asthma control. - Use albuterol  as a rescue inhaler only when wheezing or respiratory  distress occurs.  Splenomegaly Noted on previous CT. Ultrasound pending.  Note fatty liver history - Await ultrasound results to further assess splenomegaly.  Hypertension Blood pressure elevated at 140/86. Amlodipine  5 mg taken late - Ensure adherence to antihypertensive medication regimen.  Hyperlipidemia Mild hyperlipidemia with triglycerides at 158 and slightly low HDL. - Continue monitoring lipid levels.  Vitamin D  deficiency Mild vitamin D  deficiency at 42. Discussed benefits of vitamin D  supplementation for bone health and respiratory infection prevention. - Start vitamin D3 supplementation at 2000 units daily.     Will fu with patient as ultrasound and CCTA results come in           [1]  Current Outpatient Medications:    albuterol  (VENTOLIN  HFA) 108 (90 Base) MCG/ACT inhaler, Inhale 2 puffs into the lungs every 6 (six) hours as needed for wheezing or shortness of breath., Disp: 8 g, Rfl: 6   allopurinol  (ZYLOPRIM ) 100 MG tablet, TAKE 1 TABLET BY MOUTH EVERY DAY, Disp: 30 tablet, Rfl: 2   amLODipine  (NORVASC ) 5 MG tablet, TAKE 1 TABLET BY MOUTH EVERY DAY, Disp: 90 tablet, Rfl: 3   budesonide -formoterol  (SYMBICORT ) 160-4.5 MCG/ACT inhaler, Inhale 2 puffs into the lungs 2 (two) times daily., Disp: 1 each, Rfl: 3   colchicine  0.6 MG tablet, Take 0.6 mg by mouth daily., Disp: , Rfl:   "

## 2024-11-23 ENCOUNTER — Other Ambulatory Visit: Payer: Self-pay | Admitting: Internal Medicine

## 2024-11-25 ENCOUNTER — Other Ambulatory Visit: Payer: Self-pay

## 2024-11-25 NOTE — Telephone Encounter (Unsigned)
 Copied from CRM [id number]. Topic: Clinical - Medication Refill >> Nov 25, 2024  9:51 AM Joesph B wrote: Medication:  albuterol  (VENTOLIN  HFA) 108 (90 Base) MCG/ACT inhaler    amLODipine  (NORVASC ) 5 MG tablet   Has the patient contacted their pharmacy? Yes (Agent: If no, request that the patient contact the pharmacy for the refill. If patient does not wish to contact the pharmacy document the reason why and proceed with request.) (Agent: If yes, when and what did the pharmacy advise?)  This is the patient's preferred pharmacy:  [id number] - [place], [place] - [address] [place] [address] Phone: [phone] Fax: [phone]    Is this the correct pharmacy for this prescription? Yes If no, delete pharmacy and type the correct one.   Has the prescription been filled recently? Yes  Is the patient out of the medication? No  Has the patient been seen for an appointment in the last year OR does the patient have an upcoming appointment? Yes  Can we respond through MyChart? Yes  Agent: Please be advised that Rx refills may take up to 3 business days. We ask that you follow-up with your pharmacy.

## 2024-11-26 NOTE — Telephone Encounter (Signed)
 Rx 10/25/24 8g 6RF- too soon Requested Prescriptions  Pending Prescriptions Disp Refills   albuterol  (VENTOLIN  HFA) 108 (90 Base) MCG/ACT inhaler 8 g 6    Sig: Inhale 2 puffs into the lungs every 6 (six) hours as needed for wheezing or shortness of breath.     Pulmonology:  Beta Agonists 2 Failed - 11/26/2024 11:47 AM      Failed - Last BP in normal range    BP Readings from Last 1 Encounters:  11/22/24 (!) 140/86         Passed - Last Heart Rate in normal range    Pulse Readings from Last 1 Encounters:  11/22/24 69         Passed - Valid encounter within last 12 months    Recent Outpatient Visits           4 days ago Vitamin D  deficiency   [facility] [place], Parris LABOR, MD   1 month ago Vitamin D  deficiency   [facility] Everlene Parris LABOR, MD

## 2024-11-28 ENCOUNTER — Other Ambulatory Visit: Payer: Self-pay

## 2024-11-28 ENCOUNTER — Telehealth: Payer: Self-pay

## 2024-11-28 NOTE — Telephone Encounter (Signed)
 Copied from CRM [id number]. Topic: Clinical - Medication Refill >> Nov 25, 2024  9:51 AM Joesph B wrote: Medication:  albuterol  (VENTOLIN  HFA) 108 (90 Base) MCG/ACT inhaler    amLODipine  (NORVASC ) 5 MG tablet   Has the patient contacted their pharmacy? Yes (Agent: If no, request that the patient contact the pharmacy for the refill. If patient does not wish to contact the pharmacy document the reason why and proceed with request.) (Agent: If yes, when and what did the pharmacy advise?)  This is the patient's preferred pharmacy:  [id number] - [place], [place] - [address] [place] [address] Phone: [phone] Fax: [phone]    Is this the correct pharmacy for this prescription? Yes If no, delete pharmacy and type the correct one.   Has the prescription been filled recently? Yes  Is the patient out of the medication? No  Has the patient been seen for an appointment in the last year OR does the patient have an upcoming appointment? Yes  Can we respond through MyChart? Yes  Agent: Please be advised that Rx refills may take up to 3 business days. We ask that you follow-up with your pharmacy. >> Nov 28, 2024  9:30 AM Montie POUR wrote: Please check on her refill for amLODipine  (NORVASC ) 5 MG tablet  because she has been out of medication since yesterday. Chart shows pharmacy has not received refill.  Sig - Route: TAKE 1 TABLET BY MOUTH EVERY DAY - Oral   Sent to pharmacy as: amLODipine  (NORVASC ) 5 MG tablet   E-Prescribing Status: Receipt confirmed by pharmacy (11/23/2023  1:08 PM EST)    Thanks

## 2024-11-29 ENCOUNTER — Ambulatory Visit: Admitting: Sleep Medicine

## 2024-11-30 MED ORDER — AMLODIPINE BESYLATE 5 MG PO TABS: 5.0000 mg | ORAL_TABLET | Freq: Every day | ORAL | 3 refills | Status: AC

## 2024-12-02 ENCOUNTER — Other Ambulatory Visit: Payer: Self-pay

## 2024-12-05 ENCOUNTER — Encounter (HOSPITAL_COMMUNITY): Payer: Self-pay

## 2024-12-06 ENCOUNTER — Telehealth (HOSPITAL_COMMUNITY): Payer: Self-pay | Admitting: *Deleted

## 2024-12-06 NOTE — Telephone Encounter (Signed)
 Attempted to call patient regarding upcoming cardiac CT appointment. Left message on voicemail with name and callback number Sid Seats RN Navigator Cardiac Imaging [facility] [phone] Office

## 2024-12-09 ENCOUNTER — Ambulatory Visit: Admission: RE | Admit: 2024-12-09 | Source: Ambulatory Visit

## 2024-12-13 ENCOUNTER — Telehealth: Payer: Self-pay

## 2024-12-13 NOTE — Telephone Encounter (Signed)
Called patient and reviewed ultrasound results.

## 2024-12-13 NOTE — Telephone Encounter (Signed)
 Copied from CRM #[id number]. Topic: Clinical - Lab/Test Results >> Dec 13, 2024  2:10 PM Rosaria BRAVO wrote: Reason for CRM: Pt called to discuss her Ultrasound results, please advise. States that she is concerned that a mass was found and she has not received any follow up from the clinic.   Best contact: [phone]

## 2024-12-17 ENCOUNTER — Ambulatory Visit: Admitting: Sleep Medicine

## 2024-12-19 NOTE — Progress Notes (Unsigned)
 "  Subjective:    Patient ID: Amber Weiss, female    DOB: [birth year], 64 y.o.   MRN: [id number]  HPI  Pt presents to the clinic today to establish care and for management of the conditions listed below. She is transferring care from Dr. Zafirov.  OSA: She averages hours of sleep per night with the use of her CPAP. Sleep study from 09/2016 reviewed.  Asthma: Moderate, persistent. She denies chronic cough or shortness of breath. She is taking budesonide -formeterol and albuterol  as prescribed. There are no PFT's on file. She does not follow with pulmonology.  Gout: Her last uric acid level was 7.1, 09/2023. She denies recent flare on allopurinol . She has colchicine  to take as needed for breakthrough. She does not follow with rheumatology.  HTN: Her BP today is. She is taking amlodipine  as prescribed. ECG from 10/2024 reviewed.  HLD: Her last LDL was 91, triglycerides 841, 10/2024. She is not taking any cholesterol lowering medication at this time. She tries to consume a low fat diet.   Metabolic associated fatty liver disease: Her last AST/ALT was 18/14, 10/2024. US  abdomen from 11/2024 reviewed.  Past Medical History:  Diagnosis Date   Asthma    Gout    Hypertension    OSA (obstructive sleep apnea)    Sleep apnea     Current Outpatient Medications  Medication Sig Dispense Refill   albuterol  (VENTOLIN  HFA) 108 (90 Base) MCG/ACT inhaler Inhale 2 puffs into the lungs every 6 (six) hours as needed for wheezing or shortness of breath. 8 g 6   allopurinol  (ZYLOPRIM ) 100 MG tablet TAKE 1 TABLET BY MOUTH EVERY DAY 30 tablet 2   amLODipine  (NORVASC ) 5 MG tablet Take 1 tablet (5 mg total) by mouth daily. 90 tablet 3   budesonide -formoterol  (SYMBICORT ) 160-4.5 MCG/ACT inhaler Inhale 2 puffs into the lungs 2 (two) times daily. 1 each 3   colchicine  0.6 MG tablet Take 0.6 mg by mouth daily.     No current facility-administered medications for this visit.    Allergies[1]  Family History   Problem Relation Age of Onset   Cancer Mother        breast   Hypertension Mother    Emphysema Mother    Breast cancer Mother 51   Cancer Father        skin   Diabetes Father    Cancer Sister        bladder   Heart disease Neg Hx    Stroke Neg Hx     Social History   Socioeconomic History   Marital status: Married    Spouse name: Not on file   Number of children: Not on file   Years of education: Not on file   Highest education level: Not on file  Occupational History   Not on file  Tobacco Use   Smoking status: Former    Current packs/day: 0.00    Average packs/day: 1 pack/day for 12.0 years (12.0 ttl pk-yrs)    Types: Cigarettes    Start date: 09/29/1980    Quit date: 09/29/1992    Years since quitting: 32.2   Smokeless tobacco: Never  Vaping Use   Vaping status: Never Used  Substance and Sexual Activity   Alcohol use: No   Drug use: No   Sexual activity: Not on file  Other Topics Concern   Not on file  Social History Narrative   Not on file   Social Drivers of Health   Tobacco  Use: Medium Risk (10/25/2024)   Patient History    Smoking Tobacco Use: Former    Smokeless Tobacco Use: Never    Passive Exposure: Not on Actuary Strain: Not on file  Food Insecurity: Not on file  Transportation Needs: Not on file  Physical Activity: Not on file  Stress: Not on file  Social Connections: Not on file  Intimate Partner Violence: Not on file  Depression (PHQ2-9): Low Risk (11/22/2024)   Depression (PHQ2-9)    PHQ-2 Score: 2  Alcohol Screen: Not on file  Housing: Not on file  Utilities: Not on file  Health Literacy: Not on file     Constitutional: Denies fever, malaise, fatigue, headache or abrupt weight changes.  HEENT: Denies eye pain, eye redness, ear pain, ringing in the ears, wax buildup, runny nose, nasal congestion, bloody nose, or sore throat. Respiratory: Denies difficulty breathing, shortness of breath, cough or sputum production.    Cardiovascular: Denies chest pain, chest tightness, palpitations or swelling in the hands or feet.  Gastrointestinal: Denies abdominal pain, bloating, constipation, diarrhea or blood in the stool.  GU: Denies urgency, frequency, pain with urination, burning sensation, blood in urine, odor or discharge. Musculoskeletal: Denies decrease in range of motion, difficulty with gait, muscle pain or joint pain and swelling.  Skin: Denies redness, rashes, lesions or ulcercations.  Neurological: Denies dizziness, difficulty with memory, difficulty with speech or problems with balance and coordination.  Psych: Denies anxiety, depression, SI/HI.  No other specific complaints in a complete review of systems (except as listed in HPI above).  Objective    There were no vitals taken for this visit. Wt Readings from Last 3 Encounters:  11/22/24 252 lb 12.8 oz (114.7 kg)  10/25/24 253 lb 3.2 oz (114.9 kg)  10/23/23 249 lb (112.9 kg)    General: Appears their stated age, well developed, well nourished in NAD. Skin: Warm, dry and intact. No rashes, lesions or ulcerations noted. HEENT: Head: normal shape and size; Eyes: sclera white, no icterus, conjunctiva pink, PERRLA and EOMs intact; Ears: Tm's gray and intact, normal light reflex; Nose: mucosa pink and moist, septum midline; Throat/Mouth: Teeth present, mucosa pink and moist, no exudate, lesions or ulcerations noted.  Neck:  Neck supple, trachea midline. No masses, lumps or thyromegaly present.  Cardiovascular: Normal rate and rhythm. S1,S2 noted.  No murmur, rubs or gallops noted. No JVD or BLE edema. No carotid bruits noted. Pulmonary/Chest: Normal effort and positive vesicular breath sounds. No respiratory distress. No wheezes, rales or ronchi noted.  Abdomen: Soft and nontender. Normal bowel sounds. No distention or masses noted. Liver, spleen and kidneys non palpable. Musculoskeletal: Normal range of motion. No signs of joint swelling. No difficulty  with gait.  Neurological: Alert and oriented. Cranial nerves II-XII grossly intact. Coordination normal.  Psychiatric: Mood and affect normal. Behavior is normal. Judgment and thought content normal.   EKG:  BMET    Component Value Date/Time   NA 142 10/25/2024 1057   NA 144 09/28/2023 1514   NA 141 07/24/2014 1440   K 3.9 10/25/2024 1057   K 4.0 07/24/2014 1440   CL 103 10/25/2024 1057   CL 106 07/24/2014 1440   CO2 30 10/25/2024 1057   CO2 30 07/24/2014 1440   GLUCOSE 77 10/25/2024 1057   GLUCOSE 129 (H) 07/24/2014 1440   BUN 17 10/25/2024 1057   BUN 15 09/28/2023 1514   BUN 17 07/24/2014 1440   CREATININE 1.03 10/25/2024 1057   CALCIUM  9.3 10/25/2024 1057   CALCIUM 8.5 07/24/2014 1440   GFRNONAA 75 05/08/2020 1541   GFRAA 87 05/08/2020 1541    Lipid Panel     Component Value Date/Time   CHOL 160 10/25/2024 1057   CHOL 164 09/28/2023 1514   TRIG 158 (H) 10/25/2024 1057   HDL 44 (L) 10/25/2024 1057   HDL 41 09/28/2023 1514   CHOLHDL 3.6 10/25/2024 1057   VLDL 42 (H) 06/10/2016 1107   LDLCALC 91 10/25/2024 1057    CBC    Component Value Date/Time   WBC 6.7 10/25/2024 1057   RBC 4.25 10/25/2024 1057   HGB 12.8 10/25/2024 1057   HGB 12.6 09/28/2023 1514   HCT 39.7 10/25/2024 1057   HCT 38.6 09/28/2023 1514   PLT 163 10/25/2024 1057   PLT 177 09/28/2023 1514   MCV 93.4 10/25/2024 1057   MCV 93 09/28/2023 1514   MCV 90 07/24/2014 1440   MCH 30.1 10/25/2024 1057   MCHC 32.2 10/25/2024 1057   RDW 14.7 10/25/2024 1057   RDW 13.4 09/28/2023 1514   RDW 16.4 (H) 07/24/2014 1440   LYMPHSABS 1.7 09/28/2023 1514   MONOABS 350 04/06/2017 1519   EOSABS 422 10/25/2024 1057   EOSABS 0.4 09/28/2023 1514   BASOSABS 40 10/25/2024 1057   BASOSABS 0.0 09/28/2023 1514    Hgb A1C Lab Results  Component Value Date   HGBA1C 5.1 10/25/2024       Assessment and Plan       RTC in 6 months for your annual exam Angeline Laura, NP    [1]  Allergies Allergen  Reactions   Penicillins    Sulfa  Antibiotics Other (See Comments)    Unknown reaction    "

## 2024-12-20 ENCOUNTER — Telehealth (HOSPITAL_COMMUNITY): Payer: Self-pay | Admitting: *Deleted

## 2024-12-20 ENCOUNTER — Ambulatory Visit: Admitting: Internal Medicine

## 2024-12-20 NOTE — Telephone Encounter (Signed)
 Attempted to call patient regarding upcoming cardiac CT appointment. Left message on voicemail with name and callback number Sid Seats RN Navigator Cardiac Imaging [facility] [phone] Office

## 2024-12-23 ENCOUNTER — Ambulatory Visit

## 2024-12-26 ENCOUNTER — Ambulatory Visit: Admitting: Sleep Medicine
